# Patient Record
Sex: Male | Born: 1955 | Race: Black or African American | Hispanic: No | Marital: Single | Smoking: Never smoker
Health system: Southern US, Community
[De-identification: ages and names within clinical notes are randomized; demographics above are authoritative.]

## PROBLEM LIST (undated history)

## (undated) DIAGNOSIS — E785 Hyperlipidemia, unspecified: Secondary | ICD-10-CM

## (undated) DIAGNOSIS — Z9889 Other specified postprocedural states: Secondary | ICD-10-CM

## (undated) HISTORY — DX: Other specified postprocedural states: Z98.890

---

## 1984-08-08 HISTORY — PX: OTHER SURGICAL HISTORY: SHX169

## 2011-05-16 ENCOUNTER — Encounter: Payer: Self-pay | Admitting: Emergency Medicine

## 2011-05-16 ENCOUNTER — Emergency Department (HOSPITAL_COMMUNITY)
Admission: EM | Admit: 2011-05-16 | Discharge: 2011-05-16 | Disposition: A | Discharge status: Home / Self Care | Payer: Self-pay | Attending: Emergency Medicine | Admitting: Emergency Medicine

## 2011-05-16 ENCOUNTER — Emergency Department (HOSPITAL_COMMUNITY): Payer: Self-pay

## 2011-05-16 DIAGNOSIS — R079 Chest pain, unspecified: Secondary | ICD-10-CM | POA: Insufficient documentation

## 2011-05-16 LAB — CBC
HCT: 45.6 % (ref 39.0–52.0)
MCH: 30.3 pg (ref 26.0–34.0)
MCHC: 34 g/dL (ref 30.0–36.0)
MCV: 89.2 fL (ref 78.0–100.0)
RDW: 12.7 % (ref 11.5–15.5)

## 2011-05-16 LAB — CARDIAC PANEL(CRET KIN+CKTOT+MB+TROPI): Relative Index: 1.5 (ref 0.0–2.5)

## 2011-05-16 LAB — BASIC METABOLIC PANEL
BUN: 15 mg/dL (ref 6–23)
Calcium: 9.8 mg/dL (ref 8.4–10.5)
Creatinine, Ser: 1.02 mg/dL (ref 0.50–1.35)
GFR calc Af Amer: >90 mL/min (ref >90)
GFR calc non Af Amer: 81 mL/min — ABNORMAL LOW (ref >90)

## 2011-05-16 MED ORDER — SODIUM CHLORIDE 0.9 % IV SOLN: INTRAVENOUS | Status: DC

## 2011-05-16 MED ORDER — ASPIRIN 81 MG PO CHEW
324.0000 mg | CHEWABLE_TABLET | Freq: Once | ORAL | Status: AC
  Administered 2011-05-16: 324 mg via ORAL
  Filled 2011-05-16: qty 4

## 2011-05-16 MED ORDER — IOHEXOL 350 MG/ML SOLN
100.0000 mL | Freq: Once | INTRAVENOUS | Status: AC | PRN
  Administered 2011-05-16: 100 mL via INTRAVENOUS

## 2011-05-16 NOTE — ED Notes (Signed)
Chest pain started yesterday to L side of chest that was intermittant. Constant today with pain radiating down l arm. Nondiaphoretic. Denies sob/n/v/d/dizziness. States both arms were swollen yesterday but normal now.

## 2011-05-16 NOTE — ED Provider Notes (Signed)
History   Chart scribed for Doug Sou, MD by Enos Fling; the patient was seen in room APA02/APA02; this patient's care was started at 12:57 PM.    CSN: [id number] Arrival date & time: 05/16/2011 12:27 PM  Chief Complaint  Patient presents with  . Chest Pain   HPI Victor Mathis is a 55 y.o. male who presents to the Emergency Department complaining of chest pain. Pt reports intermittent anterior chest pain onset yesterday at 10 AM. Pain is radiating to bilateral arms today. Pain is worse with lying down and improved with sitting up. No change in pain with walking or exertion. Denies sob, n/v, or diaphoresis. Pt tried milk of magnesia with no relief. States he is asymptomatic currently. Cardiac risk factors of male and age, otherwise none.   History reviewed. No pertinent past medical history.  History reviewed. No pertinent past surgical history.  History reviewed. No pertinent family history.  History  Substance Use Topics  . Smoking status: Never Smoker   . Smokeless tobacco: Not on file  . Alcohol Use: No      Review of Systems  Constitutional: Negative.   HENT: Negative.   Respiratory: Negative.   Cardiovascular: Positive for chest pain.  Gastrointestinal: Negative.   Musculoskeletal: Negative.   Skin: Negative.   Neurological: Negative.   Hematological: Negative.   Psychiatric/Behavioral: Negative.     Allergies  Review of patient's allergies indicates no known allergies.  Home Medications  No current outpatient prescriptions on file.  BP 167/81  Pulse 66  Temp(Src) 98 F (36.7 C) (Oral)  Resp 22  Ht 5\' 7"  (1.702 m)  Wt 145 lb (65.772 kg)  BMI 22.71 kg/m2  SpO2 100%  Physical Exam  Constitutional: He appears well-developed and well-nourished.  HENT:  Head: Normocephalic and atraumatic.  Eyes: Conjunctivae are normal. Pupils are equal, round, and reactive to light.  Neck: Neck supple. No tracheal deviation present. No thyromegaly present.    Cardiovascular: Normal rate and regular rhythm.   No murmur heard. Pulmonary/Chest: Effort normal and breath sounds normal. He exhibits no tenderness.  Abdominal: Soft. Bowel sounds are normal. He exhibits no distension. There is no tenderness.  Musculoskeletal: Normal range of motion. He exhibits no edema and no tenderness.  Neurological: He is alert. Coordination normal.  Skin: Skin is warm and dry. No rash noted.  Psychiatric: He has a normal mood and affect.    ED Course  Procedures - none  Labs Reviewed  BASIC METABOLIC PANEL - Abnormal; Notable for the following:    GFR calc non Af Amer 81 (*)    All other components within normal limits  CARDIAC PANEL(CRET KIN+CKTOT+MB+TROPI) - Abnormal; Notable for the following:    Total CK 240 (*)    All other components within normal limits  CBC  TROPONIN I   Dg Chest Portable 1 View  05/16/2011  *RADIOLOGY REPORT*  Clinical Data: Chest pain  PORTABLE CHEST - 1 VIEW  Comparison: Portable exam 1301 hours without priors for comparison.  Findings: Normal heart size, mediastinal contours, and pulmonary vascularity. Lungs clear. No pleural effusion or pneumothorax. Osseous structures unremarkable.  IMPRESSION: No acute abnormalities.  Original Report Authenticated By: Lollie Marrow, M.D.     MDM  Chest pain felt to be highly atypical for acute coronary syndrome in this male with no risk factors other cardiac risk factors nonexertional pain and normal EKG. Atypical for aortic dissection, normal chest x-ray strongly doubt pulmonary embolism i.e. no shortness of breath Spoke  with Dr.Rothbart. Plan an appointment has been scheduled with the [facility] cardiology office for 05/20/2011 at 1:40 PM Diagnoses atypical chest SCRIBE ATTESTATION:  I personally performed the services described in this documentation, which was scribed in my presence. The recorded information has been reviewed and considered.        Doug Sou, MD 05/16/11  1625

## 2011-05-16 NOTE — ED Notes (Signed)
Pt up to bathroom without assistance.  States while on his way back to room, pt had episode of left sided cp radiating down left arm.  States only lasted a few seconds and after sitting, it went away.  States pain only comes when he stands.  nad noted at this time.  Denies pain at this time.

## 2011-05-20 ENCOUNTER — Ambulatory Visit: Payer: Self-pay | Admitting: Cardiology

## 2011-05-28 ENCOUNTER — Emergency Department: Payer: Self-pay | Admitting: *Deleted

## 2011-06-02 ENCOUNTER — Ambulatory Visit (INDEPENDENT_AMBULATORY_CARE_PROVIDER_SITE_OTHER): Payer: Self-pay | Admitting: Cardiology

## 2011-06-02 ENCOUNTER — Encounter: Payer: Self-pay | Admitting: Cardiology

## 2011-06-02 DIAGNOSIS — M79609 Pain in unspecified limb: Secondary | ICD-10-CM

## 2011-06-02 DIAGNOSIS — R079 Chest pain, unspecified: Secondary | ICD-10-CM | POA: Insufficient documentation

## 2011-06-02 DIAGNOSIS — M79603 Pain in arm, unspecified: Secondary | ICD-10-CM | POA: Insufficient documentation

## 2011-06-02 NOTE — Assessment & Plan Note (Signed)
These particular symptoms seem much more musculoskeletal in etiology.

## 2011-06-02 NOTE — Progress Notes (Signed)
Clinical Summary Victor Mathis is a 55 y.o.male referred for cardiology consultation by Dr. Ethelda Chick in the ER. Patient was seen back in early October with chest pain symptoms, had normal troponin at that time, also essentially normal chest CT angiogram. Patient was discussed with Dr. Dietrich Pates, and an office visit was recommended.  Victor Mathis states that he has not had any regular medical care over the years, does not have health insurance. Since his visit at [facility], he was also seen in the ER at [facility] with similar symptoms. He states that he was referred to a primary care physician and plans to see her next week.  He describes intermittent left arm and chest pain symptoms over the last several weeks. Interestingly, he states that his dog pulled him abruptly while holding the leash in the left arm, and has had left arm pain and weakness since that time, mainly in the biceps area. He has also had a "tightness" in his left chest that seems more exertional in nature. He has had some improvement in symptoms with pain medication and simethicone.  His ECG is reviewed and is essentially normal. He has not undergone any prior risk stratification. Reports no known history of hypertension, hyperlipidemia, or diabetes. Denies any smoking history. No definite family history of premature cardiovascular disease.   No Known Allergies  Medication list reviewed.  History reviewed. No pertinent past medical history.  Past Surgical History  Procedure Date  . Left eye surgery 1986    [place]    Family History  Problem Relation Age of Onset  . Diabetes type II Father   . Dementia Mother     Social History Victor Mathis reports that he has never smoked. He does not have any smokeless tobacco history on file. Victor Mathis reports that he drinks alcohol.  Review of Systems No palpitations or syncope. No speech or progressive motor deficits. Reports stable appetite. Otherwise negative except as  outlined.  Physical Examination Filed Vitals:   06/02/11 0912  BP: 137/87  Pulse: 61  Resp: 16   Normally nourished appearing male in no acute distress. HEENT: Conjunctiva and lids are normal, oropharynx with moist mucosa. Neck: Supple, no elevated JVP or bruits, no thyromegaly. Lungs: Clear to auscultation, nonlabored. Cardiac: Regular rate and rhythm, no significant murmur rub or gallop. Abdomen: Soft, nontender, bowel sounds present. Skin: Warm and dry. Extremities: No pitting edema, distal pulses full. Does have some discomfort in the left arm with flexion. Musculoskeletal: No kyphosis. Neuropsychiatric: Alert and oriented x3, affect appropriate.  ECG Sinus bradycardia at 55, normal.   Problem List and Plan

## 2011-06-02 NOTE — Assessment & Plan Note (Signed)
Largely atypical in description, somewhat better with simethicone and analgesics, however he does describe an exertional component as well. A male in his 85s does have some increased cardiovascular risk, although he denies any known history of hypertension, diabetes, or hyperlipidemia. No definite family history of premature CAD. Chest CT angiogram was essentially normal as was troponin level during his ER visit earlier this month. Baseline ECG is also normal. Plan is to perform a standard treadmill test, and if normal, would likely not pursue any further cardiovascular testing unless his symptoms progress. I did explain that it was important for him to establish with a primary care provider to have more regular followup.

## 2011-06-02 NOTE — Patient Instructions (Signed)
**Note De-Identified Victor Mathis Obfuscation** Your physician has requested that you have an exercise tolerance test. For further information please visit [email or web address] Please also follow instruction sheet, as given.  Your physician recommends that you continue on your current medications as directed. Please refer to the Current Medication list given to you today.  Your physician recommends that you schedule a follow-up appointment in: as needed

## 2011-06-09 ENCOUNTER — Ambulatory Visit (INDEPENDENT_AMBULATORY_CARE_PROVIDER_SITE_OTHER): Payer: Self-pay | Admitting: *Deleted

## 2011-06-09 DIAGNOSIS — R079 Chest pain, unspecified: Secondary | ICD-10-CM

## 2011-06-09 NOTE — Progress Notes (Signed)
Stress Lab Nurses Notes - Victor Mathis  [name] 06/09/2011  Reason for doing test: Chest Pain  Type of test: Regular GTX  Nurse performing test: Parke Poisson, RN  Nuclear Medicine Tech: Not Applicable  Echo Tech: Not Applicable  MD performing test: R. Rothbart  Family MD: NPCP  Test explained and consent signed: yes  IV started: No IV started  Symptoms: Fatigue and chest discomfort  Treatment/Intervention: None  Reason test stopped: fatigue and reached target HR  After recovery IV was: NA  Patient to return to Nuc. Med at : NA  Patient discharged: Home  Patient's Condition upon discharge was: stable  Comments:  During test peak BP 158/78 & HR 155 .   Recovery BP 128/68 & HR 78.  Symptoms resolved in recovery.  Erskine Speed T

## 2011-06-10 ENCOUNTER — Encounter: Payer: Self-pay | Admitting: Cardiology

## 2011-06-15 ENCOUNTER — Other Ambulatory Visit: Payer: Self-pay

## 2011-06-15 ENCOUNTER — Other Ambulatory Visit: Payer: Self-pay | Admitting: Cardiology

## 2011-06-15 DIAGNOSIS — M79609 Pain in unspecified limb: Secondary | ICD-10-CM

## 2011-06-15 DIAGNOSIS — R079 Chest pain, unspecified: Secondary | ICD-10-CM

## 2011-06-16 ENCOUNTER — Ambulatory Visit (INDEPENDENT_AMBULATORY_CARE_PROVIDER_SITE_OTHER): Payer: Self-pay | Admitting: Cardiology

## 2011-06-16 ENCOUNTER — Encounter: Payer: Self-pay | Admitting: Cardiology

## 2011-06-16 VITALS — BP 133/77 | HR 79 | Ht 67.0 in | Wt 149.0 lb

## 2011-06-16 DIAGNOSIS — E782 Mixed hyperlipidemia: Secondary | ICD-10-CM | POA: Insufficient documentation

## 2011-06-16 DIAGNOSIS — Z13220 Encounter for screening for lipoid disorders: Secondary | ICD-10-CM

## 2011-06-16 DIAGNOSIS — I2510 Atherosclerotic heart disease of native coronary artery without angina pectoris: Secondary | ICD-10-CM

## 2011-06-16 MED ORDER — NITROGLYCERIN 0.4 MG SL SUBL: 0.4000 mg | SUBLINGUAL_TABLET | SUBLINGUAL | Status: DC | PRN

## 2011-06-16 MED ORDER — SIMVASTATIN 20 MG PO TABS: 20.0000 mg | ORAL_TABLET | Freq: Every day | ORAL | Status: DC

## 2011-06-16 MED ORDER — METOPROLOL SUCCINATE ER 25 MG PO TB24: 25.0000 mg | ORAL_TABLET | Freq: Every day | ORAL | Status: DC

## 2011-06-16 NOTE — Assessment & Plan Note (Signed)
Baseline fasting lipid profile to be obtained as well. Statin therapy has already been initiated for plaque stabilization and risk reduction.

## 2011-06-16 NOTE — Progress Notes (Signed)
Clinical Summary Victor Mathis is a 55 y.o.male presenting for followup. He was seen recently in October with somewhat atypical chest pains, some with exertion, others that seemed more musculoskeletal. I referred him for a standard GXT, which was found to be abnormal by electrocardiographic criteria at a maximum workload of 9.8 METs, peak blood pressure 158/78, peak ST segment depression of approximately 1.5 -2.0 millimeters in leads 2, 3, aVF, 1.0 -1.5 mm in V4 through V6.  We discussed the results and implications of his testing, likelihood that he has underlying CAD, and options for treatment and long-term management. He has been on no specific medical therapy as yet, certainly a reasonable first step. May need to pursue further testing depending on symptom control. He voiced comfort with this approach.  He has not had a lipid profile to assess his cholesterol status.   No Known Allergies  Medication list reviewed.  No past medical history on file.  Past Surgical History  Procedure Date  . Left eye surgery 1986    [place]    Family History  Problem Relation Age of Onset  . Diabetes type II Father   . Dementia Mother     Social History Victor Mathis reports that he has never smoked. He does not have any smokeless tobacco history on file. Victor Mathis reports that he drinks alcohol.  Review of Systems No palpitations or syncope. Stable appetite. No orthopnea. Otherwise negative.  Physical Examination Filed Vitals:   06/16/11 0842  BP: 133/77  Pulse: 79    Normally nourished appearing male in no acute distress.  HEENT: Conjunctiva and lids are normal, oropharynx with moist mucosa.  Neck: Supple, no elevated JVP or bruits, no thyromegaly.  Lungs: Clear to auscultation, nonlabored.  Cardiac: Regular rate and rhythm, no significant murmur rub or gallop.  Abdomen: Soft, nontender, bowel sounds present.  Skin: Warm and dry.  Extremities: No pitting edema, distal pulses  full. Musculoskeletal: No kyphosis.  Neuropsychiatric: Alert and oriented x3, affect appropriate.    Problem List and Plan

## 2011-06-16 NOTE — Patient Instructions (Signed)
Your physician recommends that you schedule a follow-up appointment in: 1 month with Dr [facility]   Your physician has recommended you make the following change in your medication:   Nitroglycerin 0.4 mg as needed for chest discomfort Toprol xl 25 mg daily Simvastatin 20 mg at bedtime  Your physician recommends that you return for lab work in: This week (Lipids)

## 2011-06-16 NOTE — Assessment & Plan Note (Signed)
Diagnosis based on symptoms and also recent abnormal GXT. As noted above, initial plan is to institute basic medical therapy and arrange followup. Plan to begin aspirin 325 mg daily, Toprol-XL 25 mg daily, Zocor 20 mg daily, and nitroglycerin p.r.n - all generic formulations. We are also requesting a social work consult to see if the patient qualifies for any specific health care coverage or assistance, as additional studies may be required down the road.

## 2011-07-18 ENCOUNTER — Ambulatory Visit (INDEPENDENT_AMBULATORY_CARE_PROVIDER_SITE_OTHER): Payer: Self-pay | Admitting: Cardiology

## 2011-07-18 ENCOUNTER — Encounter: Payer: Self-pay | Admitting: Cardiology

## 2011-07-18 VITALS — BP 108/73 | HR 65 | Resp 16 | Ht 67.0 in | Wt 144.0 lb

## 2011-07-18 DIAGNOSIS — E782 Mixed hyperlipidemia: Secondary | ICD-10-CM

## 2011-07-18 DIAGNOSIS — I2510 Atherosclerotic heart disease of native coronary artery without angina pectoris: Secondary | ICD-10-CM

## 2011-07-18 DIAGNOSIS — Z13220 Encounter for screening for lipoid disorders: Secondary | ICD-10-CM

## 2011-07-18 NOTE — Assessment & Plan Note (Signed)
Diagnosis based on abnormal GXT noted previously. For now plan to continue medical therapy and observation after reviewing the options with him. Further testing, likely cardiac catheterization, can be considered if symptoms progress.

## 2011-07-18 NOTE — Progress Notes (Signed)
Clinical Summary Victor Mathis is a 55 y.o.male presenting for followup. He was seen in November. He reports improvement in symptoms, citing some that are likely anginal in nature and others that seem more musculoskeletal or neuropathic in nature. He reports compliance with his medications.  He states that he forgot to get lab work for evaluation of lipid status.  We reviewed options for treatment and further diagnosis, and at this point he prefers to continue medical therapy.   No Known Allergies  Medication list reviewed.  Past Medical History  Diagnosis Date  . Coronary atherosclerosis of native coronary artery     Based on abnormal GXT    Social History Victor Mathis reports that he has never smoked. He does not have any smokeless tobacco history on file. Victor Mathis reports that he drinks alcohol.  Review of Systems No palpitations or syncope. Otherwise negative except as outlined above.  Physical Examination Filed Vitals:   07/18/11 0852  BP: 108/73  Pulse: 65  Resp: 16   Normally nourished appearing male in no acute distress.  HEENT: Conjunctiva and lids are normal, oropharynx with moist mucosa.  Neck: Supple, no elevated JVP or bruits, no thyromegaly.  Lungs: Clear to auscultation, nonlabored.  Cardiac: Regular rate and rhythm, no significant murmur rub or gallop.  Abdomen: Soft, nontender, bowel sounds present.  Skin: Warm and dry.  Extremities: No pitting edema, distal pulses full.  Musculoskeletal: No kyphosis.  Neuropsychiatric: Alert and oriented x3, affect appropriate.    Problem List and Plan

## 2011-07-18 NOTE — Assessment & Plan Note (Signed)
Patient did not present for lab work. Will arrange LFT and FLP since on Zocor.

## 2011-07-18 NOTE — Patient Instructions (Signed)
Your physician recommends that you continue on your current medications as directed. Please refer to the Current Medication list given to you today.  Your physician recommends that you return for lab work in: THIS WEEK  Your physician recommends that you schedule a follow-up appointment in: 3 months

## 2011-07-19 LAB — LIPID PANEL
Cholesterol: 161 mg/dL (ref 0–200)
Triglycerides: 144 mg/dL (ref <150)
VLDL: 29 mg/dL (ref 0–40)

## 2011-07-19 LAB — HEPATIC FUNCTION PANEL
ALT: 12 U/L (ref 0–53)
Indirect Bilirubin: 0.4 mg/dL (ref 0.0–0.9)
Total Protein: 7.3 g/dL (ref 6.0–8.3)

## 2011-09-04 ENCOUNTER — Emergency Department: Payer: Self-pay | Admitting: Internal Medicine

## 2011-10-17 ENCOUNTER — Encounter: Payer: Self-pay | Admitting: Cardiology

## 2011-10-17 ENCOUNTER — Ambulatory Visit (INDEPENDENT_AMBULATORY_CARE_PROVIDER_SITE_OTHER): Payer: Self-pay | Admitting: Cardiology

## 2011-10-17 VITALS — BP 119/80 | HR 59 | Resp 16 | Ht 67.0 in | Wt 148.0 lb

## 2011-10-17 DIAGNOSIS — M79609 Pain in unspecified limb: Secondary | ICD-10-CM

## 2011-10-17 DIAGNOSIS — Z13220 Encounter for screening for lipoid disorders: Secondary | ICD-10-CM

## 2011-10-17 DIAGNOSIS — M79603 Pain in arm, unspecified: Secondary | ICD-10-CM

## 2011-10-17 DIAGNOSIS — I2510 Atherosclerotic heart disease of native coronary artery without angina pectoris: Secondary | ICD-10-CM

## 2011-10-17 DIAGNOSIS — E782 Mixed hyperlipidemia: Secondary | ICD-10-CM

## 2011-10-17 NOTE — Assessment & Plan Note (Signed)
Continue statin therapy. Check FLP and LFT for next visit.

## 2011-10-17 NOTE — Assessment & Plan Note (Signed)
Symptomatically stable on medical therapy. No changes made today. Keep followup with [facility] in interim.

## 2011-10-17 NOTE — Patient Instructions (Signed)
**Note De-identified Otillia Cordone Obfuscation** Your physician recommends that you continue on your current medications as directed. Please refer to the Current Medication list given to you today.  Your physician recommends that you return for lab work in: just before next visit in 6 months  Your physician recommends that you schedule a follow-up appointment in: 6 months   

## 2011-10-17 NOTE — Progress Notes (Signed)
   Clinical Summary [name] is a 56 y.o.male presenting for followup. He was seen in December 2012. We have been managing him medically for probable CAD based on abnormal GXT and symptoms.  Lab work from December 2012 showed cholesterol 161, triglycerides 144, HDL 44, LDL 88, AST 18, ALT 12. We reviewed these today.  He reports no clear angina. Does have atypical left shoulder discomfort and spasms at night, especially when he sleeps on his left side.  He states that he has been taking his medications most of the time.   No Known Allergies  Current Outpatient Prescriptions  Medication Sig Dispense Refill  . aspirin 81 MG tablet Take 81 mg by mouth daily.        . metoprolol succinate (TOPROL-XL) 25 MG 24 hr tablet Take 1 tablet (25 mg total) by mouth daily.  30 tablet  12  . nitroGLYCERIN (NITROSTAT) 0.4 MG SL tablet Place 1 tablet (0.4 mg total) under the tongue every 5 (five) minutes as needed for chest pain.  30 tablet  3  . Simethicone (GAS RELIEF PO) Take by mouth as needed.        . simvastatin (ZOCOR) 20 MG tablet Take 1 tablet (20 mg total) by mouth at bedtime.  30 tablet  12    Past Medical History  Diagnosis Date  . Coronary atherosclerosis of native coronary artery     Based on abnormal GXT    Social History [name] reports that he has never smoked. He does not have any smokeless tobacco history on file. [name] reports that he drinks alcohol.  Review of Systems No palpitations or syncope. Reports trouble with his memory, also vision problems - he plans to be seen at the [facility] to discuss these issues. Otherwise negative.  Physical Examination Filed Vitals:   10/17/11 0843  BP: 119/80  Pulse: 59  Resp: 16    Normally nourished appearing male in no acute distress.  HEENT: Conjunctiva and lids are normal, oropharynx with moist mucosa.  Neck: Supple, no elevated JVP or bruits, no thyromegaly.  Lungs: Clear to auscultation, nonlabored.  Cardiac: Regular  rate and rhythm, no significant murmur rub or gallop.  Abdomen: Soft, nontender, bowel sounds present.  Skin: Warm and dry.  Extremities: No pitting edema, distal pulses full.  Musculoskeletal: No kyphosis.  Neuropsychiatric: Alert and oriented x3, affect appropriate.    Problem List and Plan

## 2011-10-17 NOTE — Assessment & Plan Note (Signed)
Some of these symptoms are atypical and likely musculoskeletal.

## 2012-01-02 ENCOUNTER — Emergency Department: Payer: Self-pay | Admitting: *Deleted

## 2012-01-02 LAB — COMPREHENSIVE METABOLIC PANEL
Anion Gap: 12 (ref 7–16)
BUN: 13 mg/dL (ref 7–18)
Chloride: 103 mmol/L (ref 98–107)
Co2: 24 mmol/L (ref 21–32)
Creatinine: 1.25 mg/dL (ref 0.60–1.30)
EGFR (African American): >60
Osmolality: 279 (ref 275–301)
Potassium: 3.6 mmol/L (ref 3.5–5.1)
SGOT(AST): 29 U/L (ref 15–37)
SGPT (ALT): 30 U/L
Sodium: 139 mmol/L (ref 136–145)
Total Protein: 7.8 g/dL (ref 6.4–8.2)

## 2012-01-02 LAB — CBC
HGB: 15.2 g/dL (ref 13.0–18.0)
MCHC: 33.5 g/dL (ref 32.0–36.0)
RBC: 5.17 10*6/uL (ref 4.40–5.90)
RDW: 14.1 % (ref 11.5–14.5)
WBC: 11.1 10*3/uL — ABNORMAL HIGH (ref 3.8–10.6)

## 2012-01-02 LAB — CK TOTAL AND CKMB (NOT AT ARMC): CK, Total: 114 U/L (ref 35–232)

## 2012-01-02 LAB — TROPONIN I: Troponin-I: <0.02 ng/mL

## 2012-01-18 ENCOUNTER — Emergency Department (HOSPITAL_COMMUNITY): Payer: Medicaid Other

## 2012-01-18 ENCOUNTER — Inpatient Hospital Stay (HOSPITAL_COMMUNITY)
Admission: EM | Admit: 2012-01-18 | Discharge: 2012-01-20 | DRG: 287 | Disposition: A | Discharge status: Home / Self Care | Payer: Medicaid Other | Attending: Internal Medicine | Admitting: Internal Medicine

## 2012-01-18 ENCOUNTER — Encounter (HOSPITAL_COMMUNITY): Payer: Self-pay | Admitting: *Deleted

## 2012-01-18 DIAGNOSIS — M62838 Other muscle spasm: Secondary | ICD-10-CM

## 2012-01-18 DIAGNOSIS — A0472 Enterocolitis due to Clostridium difficile, not specified as recurrent: Secondary | ICD-10-CM | POA: Diagnosis present

## 2012-01-18 DIAGNOSIS — R079 Chest pain, unspecified: Principal | ICD-10-CM | POA: Diagnosis present

## 2012-01-18 DIAGNOSIS — I2510 Atherosclerotic heart disease of native coronary artery without angina pectoris: Secondary | ICD-10-CM | POA: Diagnosis present

## 2012-01-18 DIAGNOSIS — G8929 Other chronic pain: Secondary | ICD-10-CM

## 2012-01-18 DIAGNOSIS — E782 Mixed hyperlipidemia: Secondary | ICD-10-CM | POA: Diagnosis present

## 2012-01-18 DIAGNOSIS — I200 Unstable angina: Secondary | ICD-10-CM

## 2012-01-18 HISTORY — DX: Hyperlipidemia, unspecified: E78.5

## 2012-01-18 LAB — BASIC METABOLIC PANEL
GFR calc Af Amer: 80 mL/min — ABNORMAL LOW (ref >90)
GFR calc non Af Amer: 69 mL/min — ABNORMAL LOW (ref >90)
Potassium: 3.8 mEq/L (ref 3.5–5.1)
Sodium: 137 mEq/L (ref 135–145)

## 2012-01-18 LAB — DIFFERENTIAL
Basophils Relative: 0 % (ref 0–1)
Eosinophils Absolute: 0.4 10*3/uL (ref 0.0–0.7)
Lymphs Abs: 2.5 10*3/uL (ref 0.7–4.0)
Neutrophils Relative %: 55 % (ref 43–77)

## 2012-01-18 LAB — CARDIAC PANEL(CRET KIN+CKTOT+MB+TROPI)
CK, MB: 2.3 ng/mL (ref 0.3–4.0)
Relative Index: INVALID (ref 0.0–2.5)
Troponin I: <0.3 ng/mL (ref <0.30)

## 2012-01-18 LAB — CBC
MCH: 29.2 pg (ref 26.0–34.0)
MCHC: 33.9 g/dL (ref 30.0–36.0)
Platelets: 247 10*3/uL (ref 150–400)
RBC: 4.93 MIL/uL (ref 4.22–5.81)

## 2012-01-18 LAB — HEMOGLOBIN A1C
Hgb A1c MFr Bld: 5.8 % — ABNORMAL HIGH (ref <5.7)
Mean Plasma Glucose: 120 mg/dL — ABNORMAL HIGH (ref <117)

## 2012-01-18 MED ORDER — SODIUM CHLORIDE 0.9 % IV SOLN: 250.0000 mL | INTRAVENOUS | Status: DC | PRN

## 2012-01-18 MED ORDER — SODIUM CHLORIDE 0.9 % IJ SOLN
3.0000 mL | INTRAMUSCULAR | Status: DC | PRN
  Administered 2012-01-18: 3 mL via INTRAVENOUS

## 2012-01-18 MED ORDER — ASPIRIN 81 MG PO CHEW
81.0000 mg | CHEWABLE_TABLET | Freq: Every day | ORAL | Status: DC
  Administered 2012-01-19 – 2012-01-20 (×2): 81 mg via ORAL
  Filled 2012-01-18 (×2): qty 1

## 2012-01-18 MED ORDER — BIOTENE DRY MOUTH MT LIQD: 15.0000 mL | OROMUCOSAL | Status: DC | PRN

## 2012-01-18 MED ORDER — SIMVASTATIN 20 MG PO TABS
20.0000 mg | ORAL_TABLET | Freq: Every day | ORAL | Status: DC
  Administered 2012-01-18 – 2012-01-19 (×2): 20 mg via ORAL
  Filled 2012-01-18 (×3): qty 1

## 2012-01-18 MED ORDER — SIMETHICONE 80 MG PO CHEW: 80.0000 mg | CHEWABLE_TABLET | Freq: Four times a day (QID) | ORAL | Status: DC | PRN

## 2012-01-18 MED ORDER — SODIUM CHLORIDE 0.9 % IJ SOLN: 3.0000 mL | Freq: Two times a day (BID) | INTRAMUSCULAR | Status: DC

## 2012-01-18 MED ORDER — HEPARIN (PORCINE) IN NACL 100-0.45 UNIT/ML-% IJ SOLN
900.0000 [IU]/h | INTRAMUSCULAR | Status: DC
  Administered 2012-01-18: 1000 [IU]/h via INTRAVENOUS
  Filled 2012-01-18 (×2): qty 250

## 2012-01-18 MED ORDER — NITROGLYCERIN 0.4 MG SL SUBL
0.4000 mg | SUBLINGUAL_TABLET | SUBLINGUAL | Status: DC | PRN
  Administered 2012-01-18 (×2): 0.4 mg via SUBLINGUAL
  Filled 2012-01-18: qty 25

## 2012-01-18 MED ORDER — ALPRAZOLAM 0.25 MG PO TABS
0.2500 mg | ORAL_TABLET | Freq: Two times a day (BID) | ORAL | Status: DC | PRN
  Administered 2012-01-18: 0.25 mg via ORAL
  Filled 2012-01-18: qty 1

## 2012-01-18 MED ORDER — SODIUM CHLORIDE 0.9 % IJ SOLN
3.0000 mL | Freq: Two times a day (BID) | INTRAMUSCULAR | Status: DC
  Administered 2012-01-19 – 2012-01-20 (×3): 3 mL via INTRAVENOUS

## 2012-01-18 MED ORDER — ZOLPIDEM TARTRATE 5 MG PO TABS: 10.0000 mg | ORAL_TABLET | Freq: Every evening | ORAL | Status: DC | PRN

## 2012-01-18 MED ORDER — DIAZEPAM 5 MG PO TABS
5.0000 mg | ORAL_TABLET | ORAL | Status: AC
  Administered 2012-01-19: 5 mg via ORAL
  Filled 2012-01-18: qty 1

## 2012-01-18 MED ORDER — ACETAMINOPHEN 325 MG PO TABS
650.0000 mg | ORAL_TABLET | ORAL | Status: DC | PRN
  Administered 2012-01-18 – 2012-01-20 (×3): 650 mg via ORAL
  Filled 2012-01-18 (×3): qty 2

## 2012-01-18 MED ORDER — HEPARIN BOLUS VIA INFUSION
4000.0000 [IU] | Freq: Once | INTRAVENOUS | Status: AC
  Administered 2012-01-18: 4000 [IU] via INTRAVENOUS

## 2012-01-18 MED ORDER — SODIUM CHLORIDE 0.9 % IV SOLN: INTRAVENOUS | Status: DC

## 2012-01-18 MED ORDER — METOPROLOL SUCCINATE ER 25 MG PO TB24
25.0000 mg | ORAL_TABLET | Freq: Every day | ORAL | Status: DC
  Administered 2012-01-19 – 2012-01-20 (×2): 25 mg via ORAL
  Filled 2012-01-18 (×2): qty 1

## 2012-01-18 MED ORDER — SODIUM CHLORIDE 0.9 % IJ SOLN: 3.0000 mL | INTRAMUSCULAR | Status: DC | PRN

## 2012-01-18 MED ORDER — ONDANSETRON HCL 4 MG/2ML IJ SOLN: 4.0000 mg | Freq: Four times a day (QID) | INTRAMUSCULAR | Status: DC | PRN

## 2012-01-18 MED ORDER — ASPIRIN 81 MG PO TABS: 81.0000 mg | ORAL_TABLET | Freq: Every day | ORAL | Status: DC

## 2012-01-18 MED ORDER — BIOTENE DRY MOUTH MT LIQD
15.0000 mL | Freq: Two times a day (BID) | OROMUCOSAL | Status: DC
  Administered 2012-01-18 – 2012-01-20 (×4): 15 mL via OROMUCOSAL

## 2012-01-18 MED ORDER — NITROGLYCERIN 0.4 MG SL SUBL: 0.4000 mg | SUBLINGUAL_TABLET | SUBLINGUAL | Status: DC | PRN

## 2012-01-18 MED ORDER — TICAGRELOR 90 MG PO TABS
180.0000 mg | ORAL_TABLET | Freq: Once | ORAL | Status: AC
  Administered 2012-01-18: 180 mg via ORAL
  Filled 2012-01-18: qty 2

## 2012-01-18 NOTE — Progress Notes (Signed)
ANTICOAGULATION CONSULT NOTE - Follow Up  Pharmacy Consult for Heparin  Indication: Chest pain   No Known Allergies  Patient Measurements: Height: 5\' 7"  (170.2 cm) Weight: 147 lb 11.3 oz (67 kg) IBW/kg (Calculated) : 66.1  Heparin Dosing Weight: 67 kg  Vital Signs: Temp: 98.2 F (36.8 C) (06/12 2000) Temp src: Oral (06/12 2000) BP: 114/80 mmHg (06/12 2000) Pulse Rate: 65  (06/12 2000)  Labs:  Basename 01/18/12 1436  HGB 14.4  HCT 42.5  PLT 247  CREATININE 1.16  TROPONINI <0.30    Estimated Creatinine Clearance: 66.5 ml/min (by C-G formula based on Cr of 1.16).  Medical History: Past Medical History  Diagnosis Date  . Coronary atherosclerosis of native coronary artery     Based on abnormal GXT  . Hyperlipidemia     Medications:  Prescriptions prior to admission  Medication Sig Dispense Refill  . aspirin EC 81 MG tablet Take 162 mg by mouth every 6 (six) hours as needed. For pain      . metoprolol succinate (TOPROL-XL) 25 MG 24 hr tablet Take 25 mg by mouth daily.      . nitroGLYCERIN (NITROSTAT) 0.4 MG SL tablet Place 0.4 mg under the tongue every 5 (five) minutes as needed. For chest pain      . simethicone (MYLICON) 80 MG chewable tablet Chew 80 mg by mouth every 6 (six) hours as needed. For gas pain      . simvastatin (ZOCOR) 20 MG tablet Take 20 mg by mouth at bedtime.        Assessment: 56 yo with history of CAD and hyperlipidemia presented with chest pain to [facility] and subsequently transferred here for further cardiac workup.  He was started on IV heparin with a 4000 unit IV bolus and a drip at 1000 units/hr.  This was started around 16:00PM.  He has no noted medical history significant for bleeding issues.  His most recent CBC reveals an H/H and platelet count that is within the desired goal range.  I discussed his Heparin therapy and offered to answer any questions (he had none).  Goal of Therapy:  Heparin level 0.3-0.7 units/ml Monitor platelets by  anticoagulation protocol: Yes   Plan:   Continue IV heparin at current rate of 1000 units/hr  Obtain a heparin level six hours from start and adjust therapy as needed  Monitor CBC and s/s of bleeding complications.  Nadara Mustard, PharmD., MS Clinical Pharmacist Pager:  [phone]  Thank you for allowing pharmacy to be part of this patients care team.  Assessment: PM Follow-Up - Xa = 0.79 IU/ml which is slightly above desired goal of 0.3-0.7.  No noted bleeding but we will decrease the rate slightly to maintain therapeutic goal.  Plan:  Decrease IV Heparin to 900 units/hr  F/U AM labs and adjust accordingly.  Nadara Mustard, PharmD., MS Clinical Pharmacist Pager:  [phone]  Thank you for allowing pharmacy to be part of this patients care team. 01/18/2012 10:32 PM

## 2012-01-18 NOTE — H&P (Signed)
History and Physical   Patient ID: Victor Mathis MRN: [id number], DOB/AGE: Apr 30, 1956   Admit date: 01/18/2012 Date of Consult: 01/18/2012   Primary Physician: No primary provider on file. Primary Cardiologist: Simona Huh, MD  Pt. Profile: Victor Mathis is a 56yo male with PMHx significant for CAD (suspected on abnormal exercise stress testing 11/12 and symptoms; treated medically) and hyperlipidemia who was transferred from [facility] to [facility] today for chest pain concerning for unstable angina.    Problem List: Past Medical History  Diagnosis Date  . Coronary atherosclerosis of native coronary artery     Based on abnormal GXT    Past Surgical History  Procedure Date  . Left eye surgery 1986    [place]     Allergies: No Known Allergies  PAST CARDIAC HISTORY  Standard GXT: found to be abnormal by electrocardiographic criteria at a maximum workload of 9.8 METs, peak blood pressure 158/78, peak ST segment depression of approximately 1.5 -2.0 millimeters in leads II, III, aVF, 1.0 -1.5 mm in V4 through V6.  HPI:   The patient was referred for standard GXT in 11/12 after seeing Dr. Diona Browner in clinic for atypical chest pain- some characterized as exertional, others more musculosketal. Results as noted above. The plan was made to pursue medical management of suspected CAD/anginal symptoms. He was started on ASA 81mg , Toprol-XL 25mg , Zocor 20mg  and NTG SL PRN. This was continued and found to be stable on follow-up, most recently in 10/2011.   He said he used to exercise regularly but stopped after his stress test last year. Gets intermittent CP. Says he was out shoveling dirt off his septic tank yesterday and did that without any problem. Last night noticed his chest was sore but though it was from working. In the middle of the night woke with chest pain radiating to jaw, arm and leg. Pain resolved then recurred this afternoon. Went to [facility]  Upon ED arrival, EKG revealed  no evidence of ischemia. POC troponin-I was WNL. CBC and BMET unremarkable. CXR without acute cardiopulmonary process. He was started on heparin gtt. Continued to have CP in ambulance. Treated with NTG and morphine. Now CP free. F/u ECG negative.    Home Medications: Prior to Admission medications   Medication Sig Start Date End Date Taking? Authorizing Provider  aspirin 81 MG tablet Take 81 mg by mouth daily.      Historical Provider, MD  metoprolol succinate (TOPROL-XL) 25 MG 24 hr tablet Take 1 tablet (25 mg total) by mouth daily. 06/16/11 06/15/12  Jonelle Sidle, MD  nitroGLYCERIN (NITROSTAT) 0.4 MG SL tablet Place 1 tablet (0.4 mg total) under the tongue every 5 (five) minutes as needed for chest pain. 06/16/11 06/15/12  Jonelle Sidle, MD  Simethicone (GAS RELIEF PO) Take by mouth as needed.      Historical Provider, MD  simvastatin (ZOCOR) 20 MG tablet Take 1 tablet (20 mg total) by mouth at bedtime. 06/16/11 06/15/12  Jonelle Sidle, MD    Inpatient Medications:     . heparin  4,000 Units Intravenous Once    (Not in a hospital admission)  Family History  Problem Relation Age of Onset  . Diabetes type II Father   . Dementia Mother      History   Social History  . Marital Status: Single    Spouse Name: N/A    Number of Children: N/A  . Years of Education: N/A   Occupational History  .  Unemployed    Social History Main Topics  . Smoking status: Never Smoker   . Smokeless tobacco: Not on file  . Alcohol Use: Yes     Occasional beer  . Drug Use: No  . Sexually Active: Not on file   Other Topics Concern  . Not on file   Social History Narrative  . No narrative on file     Review of Systems: General: negative for chills, fever, night sweats or weight changes.  Cardiovascular: negative for dyspnea on exertion, edema, orthopnea, palpitations, paroxysmal nocturnal dyspnea or shortness of breath Dermatological:  negative for rash Respiratory:negative for  cough or wheezing Urologic: negative for hematuria Abdominal: negative for nausea, vomiting, diarrhea, melena, or hematemesis. Small amountbright red blood per rectum, Neurologic: negative for visual changes, syncope, or dizziness All other systems reviewed and are otherwise negative except as noted above.  Physical Exam: Blood pressure 109/72, pulse 62, temperature 98.2 F (36.8 C), temperature source Oral, resp. rate 22, SpO2 98.00%.   General: Anxious Well developed, well nourished, in no acute distress. Head: Normocephalic, atraumatic, sclera non-icteric, no xanthomas, nares are without discharge.  Neck: Negative for carotid bruits. JVD not elevated. Lungs: Clear bilaterally to auscultation without wheezes, rales, or rhonchi. Breathing is unlabored. Heart: RRR with S1 S2. No murmurs, rubs, or gallops appreciated. Abdomen: Soft, non-tender, non-distended with normoactive bowel sounds. No hepatomegaly. No rebound/guarding. No obvious abdominal masses. Msk:  Strength and tone appears normal for age. Extremities: No clubbing, cyanosis or edema.  Distal pedal pulses are 2+ and equal bilaterally. Neuro: Alert and oriented X 3. Moves all extremities spontaneously. Psych:   Responds to questions appropriately with a normal affect.  Labs: Recent Labs  Basename 01/18/12 1436   WBC 8.5   HGB 14.4   HCT 42.5   MCV 86.2   PLT 247   No results found for this basename: VITAMINB12,FOLATE,FERRITIN,TIBC,IRON,RETICCTPCT in the last 72 hours No results found for this basename: DDIMER:2 in the last 72 hours  Lab 01/18/12 1436  NA 137  K 3.8  CL 101  CO2 25  BUN 13  CREATININE 1.16  CALCIUM 9.4  PROT --  BILITOT --  ALKPHOS --  ALT --  AST --  AMYLASE --  LIPASE --  GLUCOSE 76   No results found for this basename: HGBA1C in the last 72 hours Recent Labs  Basename 01/18/12 1436   CKTOTAL --   CKMB --   CKMBINDEX --   TROPONINI <0.30   No components found with this basename:  POCBNP No results found for this basename: CHOL,HDL,LDLCALC,TRIG,CHOLHDL,LDLDIRECT in the last 72 hours No results found for this basename: TSH,T4TOTAL,FREET3,T3FREE,THYROIDAB in the last 72 hours  Radiology/Studies: Dg Chest Port 1 View  01/18/2012  *RADIOLOGY REPORT*  Clinical Data: Chest pain.  Pain in the right side of the neck.  PORTABLE CHEST - 1 VIEW  Comparison: CT and plain film chest 05/16/2011.  Findings: Lungs are clear.  Heart size is normal.  No pneumothorax or pleural fluid.  No focal bony abnormality.  IMPRESSION: Negative chest.  Original Report Authenticated By: Bernadene Bell. D'ALESSIO, M.D.    EKG: SR No ST-T wave abnormalities.   Signed, R. Hurman Horn, PA-C 01/18/2012, 5:41 PM   ASSESSMENT: 1. CP concerning for [place] 2. + stress test 11/12 3. Hyperlipidemia  PLAN:  Attending: CP has typical and atypical features but given recent + stress test and response to NTG concern for [place] is high. Treat with asa, heparin, NTG, b-blocker,  statin. Will load Brillinta. Cath in am.  He is a bit reluctant about cath but we discussed at length and he agrees to proceed.   Patient seen and examined with Hurman Horn, PA-C. We discussed all aspects of the encounter. I agree with the assessment and plan as stated above.   Tayleigh Wetherell,MD 6:15 PM

## 2012-01-18 NOTE — Progress Notes (Signed)
ANTICOAGULATION CONSULT NOTE - Initial Consult  Pharmacy Consult for Heparin  Indication: Chest pain   No Known Allergies  Patient Measurements: Height: 5\' 7"  (170.2 cm) Weight: 147 lb 11.3 oz (67 kg) IBW/kg (Calculated) : 66.1  Heparin Dosing Weight: 67 kg  Vital Signs: Temp: 97.6 F (36.4 C) (06/12 1804) Temp src: Oral (06/12 1804) BP: 114/73 mmHg (06/12 1830) Pulse Rate: 60  (06/12 1830)  Labs:  Basename 01/18/12 1436  HGB 14.4  HCT 42.5  PLT 247  CREATININE 1.16  TROPONINI <0.30    Estimated Creatinine Clearance: 66.5 ml/min (by C-G formula based on Cr of 1.16).  Medical History: Past Medical History  Diagnosis Date  . Coronary atherosclerosis of native coronary artery     Based on abnormal GXT  . Hyperlipidemia     Medications:  Prescriptions prior to admission  Medication Sig Dispense Refill  . aspirin EC 81 MG tablet Take 162 mg by mouth every 6 (six) hours as needed. For pain      . metoprolol succinate (TOPROL-XL) 25 MG 24 hr tablet Take 25 mg by mouth daily.      . nitroGLYCERIN (NITROSTAT) 0.4 MG SL tablet Place 0.4 mg under the tongue every 5 (five) minutes as needed. For chest pain      . simethicone (MYLICON) 80 MG chewable tablet Chew 80 mg by mouth every 6 (six) hours as needed. For gas pain      . simvastatin (ZOCOR) 20 MG tablet Take 20 mg by mouth at bedtime.        Assessment: 56 yo with history of CAD and hyperlipidemia presented with chest pain to [facility] and subsequently transferred here for further cardiac workup.  He was started on IV heparin with a 4000 unit IV bolus and a drip at 1000 units/hr.  This was started around 16:00PM.  He has no noted medical history significant for bleeding issues.  His most recent CBC reveals an H/H and platelet count that is within the desired goal range.  I discussed his Heparin therapy and offered to answer any questions (he had none).  Goal of Therapy:  Heparin level 0.3-0.7 units/ml Monitor platelets by  anticoagulation protocol: Yes   Plan:   Continue IV heparin at current rate of 1000 units/hr  Obtain a heparin level six hours from start and adjust therapy as needed  Monitor CBC and s/s of bleeding complications.  Nadara Mustard, PharmD., MS Clinical Pharmacist Pager:  [phone]  Thank you for allowing pharmacy to be part of this patients care team. 01/18/2012,6:54 PM

## 2012-01-18 NOTE — ED Notes (Signed)
Left sided cp that started last night.  Also reports sharp pain down right side of head into right shoulder.  C/o sob, weakness, diaphoresis, dizziness/lightheadedness.

## 2012-01-18 NOTE — ED Provider Notes (Signed)
History     CSN: [id number]  Arrival date & time 01/18/12  1426   First MD Initiated Contact with Patient 01/18/12 1433      Chief Complaint  Patient presents with  . Chest Pain     Patient is a 56 y.o. male presenting with chest pain. The history is provided by the patient.  Chest Pain The chest pain began yesterday. Chest pain occurs constantly. The chest pain is worsening. The pain is associated with exertion. The severity of the pain is moderate. The quality of the pain is described as pressure-like and sharp. Radiates to: right neck, extremities. Chest pain is worsened by exertion. Primary symptoms include shortness of breath and dizziness. Pertinent negatives for primary symptoms include no fever.  Dizziness also occurs with weakness.  Associated symptoms include weakness. He tried aspirin for the symptoms.   Pt reports intermittent episodes of CP for several months He reports in past 24 hrs he has had consistent chest pain similar to prior episodes but now remaining at rest He admits that yesterday only with minimal exertion he had CP/SOB/dizziness Denies syncope He reports nonbloody diarrhea, but no vomiting for past several days No abdominal pain No focal weakness reported PATIENT HAS TAKEN ASA TODAY   Past Medical History  Diagnosis Date  . Coronary atherosclerosis of native coronary artery     Based on abnormal GXT    Past Surgical History  Procedure Date  . Left eye surgery 1986    [place]    Family History  Problem Relation Age of Onset  . Diabetes type II Father   . Dementia Mother     History  Substance Use Topics  . Smoking status: Never Smoker   . Smokeless tobacco: Not on file  . Alcohol Use: Yes     Occasional beer      Review of Systems  Constitutional: Negative for fever.  Respiratory: Positive for shortness of breath.   Cardiovascular: Positive for chest pain.  Neurological: Positive for dizziness and weakness.  All other systems  reviewed and are negative.    Allergies  Review of patient's allergies indicates no known allergies.  Home Medications   Current Outpatient Rx  Name Route Sig Dispense Refill  . ASPIRIN 81 MG PO TABS Oral Take 81 mg by mouth daily.      Marland Kitchen METOPROLOL SUCCINATE ER 25 MG PO TB24 Oral Take 1 tablet (25 mg total) by mouth daily. 30 tablet 12  . NITROGLYCERIN 0.4 MG SL SUBL Sublingual Place 1 tablet (0.4 mg total) under the tongue every 5 (five) minutes as needed for chest pain. 30 tablet 3  . GAS RELIEF PO Oral Take by mouth as needed.      Marland Kitchen SIMVASTATIN 20 MG PO TABS Oral Take 1 tablet (20 mg total) by mouth at bedtime. 30 tablet 12    BP 126/88  Temp 98 F (36.7 C) (Oral)  Resp 20  SpO2 100% BP 127/71  Pulse 62  Temp 98 F (36.7 C) (Oral)  Resp 25  SpO2 100%   Physical Exam CONSTITUTIONAL: Well developed/well nourished, uncomfortable appearing HEAD AND FACE: Normocephalic/atraumatic EYES: EOMI/PERRL, normal appearance ENMT: Mucous membranes moist.  No trismus.  No facial swelling.  Tenderness to angle of right mandible but no erythema noted. No oral abscess NECK: supple no meningeal signs, no bruits SPINE:entire spine nontender CV: S1/S2 noted, no murmurs/rubs/gallops noted LUNGS: Lungs are clear to auscultation bilaterally, no apparent distress ABDOMEN: soft, nontender, no rebound or  guarding Rectal - stool color normal GU:no cva tenderness NEURO: Pt is awake/alert, moves all extremitiesx4, no focal arm/leg weakness.  Face is symmetric EXTREMITIES: pulses normal, full ROM, no calf tenderness SKIN: warm, color normal PSYCH: no abnormalities of mood noted  ED Course  Procedures   Labs Reviewed  CBC  DIFFERENTIAL  BASIC METABOLIC PANEL  TROPONIN I   2:56 PM Pt with h/o abnormal GXT per cardiology, started medical management 3:53 PM Initial workup negative, but given that patient has had persistent CP with recent sob/diaphoresis (worsened from his baseline pain)  will admit. And he has had abnormal GXT.  D/w dr Gala Romney, will accept to [facility] cone tele for unstable angina and he asks me to start heparin.  Pt reports his CP is improved with NTG He reports his face is hurting but no obvious signs of infection or trauma to face.     MDM  Nursing notes including past medical history and social history reviewed and considered in documentation Previous records reviewed and considered - h/o CAD by stress imaging per cardiology notes xrays reviewed and considered All labs/vitals reviewed and considered       Date: 01/18/2012  Rate: 63  Rhythm: normal sinus rhythm  QRS Axis: normal  Intervals: normal  ST/T Wave abnormalities: normal  Conduction Disutrbances:none  Narrative Interpretation:   Old EKG Reviewed: unchanged    Joya Gaskins, MD 01/18/12 1556

## 2012-01-19 ENCOUNTER — Inpatient Hospital Stay (HOSPITAL_COMMUNITY): Payer: Medicaid Other

## 2012-01-19 ENCOUNTER — Encounter (HOSPITAL_COMMUNITY)
Admission: EM | Disposition: A | Discharge status: Home / Self Care | Payer: Self-pay | Source: Home / Self Care | Attending: Internal Medicine

## 2012-01-19 DIAGNOSIS — R079 Chest pain, unspecified: Secondary | ICD-10-CM

## 2012-01-19 HISTORY — PX: LEFT HEART CATHETERIZATION WITH CORONARY ANGIOGRAM: SHX5451

## 2012-01-19 LAB — CBC
Hemoglobin: 13.4 g/dL (ref 13.0–17.0)
MCH: 29.4 pg (ref 26.0–34.0)
MCV: 85.7 fL (ref 78.0–100.0)
Platelets: 231 10*3/uL (ref 150–400)
RBC: 4.56 MIL/uL (ref 4.22–5.81)
WBC: 5.8 10*3/uL (ref 4.0–10.5)

## 2012-01-19 LAB — LIPID PANEL
HDL: 41 mg/dL (ref >39)
LDL Cholesterol: 105 mg/dL — ABNORMAL HIGH (ref 0–99)
Total CHOL/HDL Ratio: 3.9 RATIO

## 2012-01-19 LAB — BASIC METABOLIC PANEL
CO2: 23 mEq/L (ref 19–32)
Calcium: 8.8 mg/dL (ref 8.4–10.5)
Chloride: 103 mEq/L (ref 96–112)
Creatinine, Ser: 1.07 mg/dL (ref 0.50–1.35)
Glucose, Bld: 88 mg/dL (ref 70–99)

## 2012-01-19 LAB — CARDIAC PANEL(CRET KIN+CKTOT+MB+TROPI)
CK, MB: 2.2 ng/mL (ref 0.3–4.0)
Total CK: 83 U/L (ref 7–232)
Troponin I: <0.3 ng/mL (ref <0.30)

## 2012-01-19 LAB — CLOSTRIDIUM DIFFICILE BY PCR: Toxigenic C. Difficile by PCR: POSITIVE — AB

## 2012-01-19 LAB — HEPARIN LEVEL (UNFRACTIONATED): Heparin Unfractionated: 0.59 IU/mL (ref 0.30–0.70)

## 2012-01-19 LAB — PROTIME-INR: Prothrombin Time: 14.1 seconds (ref 11.6–15.2)

## 2012-01-19 SURGERY — LEFT HEART CATHETERIZATION WITH CORONARY ANGIOGRAM
Anesthesia: LOCAL

## 2012-01-19 MED ORDER — MIDAZOLAM HCL 2 MG/2ML IJ SOLN
INTRAMUSCULAR | Status: AC
  Filled 2012-01-19: qty 2

## 2012-01-19 MED ORDER — VERAPAMIL HCL 2.5 MG/ML IV SOLN
INTRAVENOUS | Status: AC
  Filled 2012-01-19: qty 2

## 2012-01-19 MED ORDER — FENTANYL CITRATE 0.05 MG/ML IJ SOLN
INTRAMUSCULAR | Status: AC
  Filled 2012-01-19: qty 2

## 2012-01-19 MED ORDER — HEPARIN SODIUM (PORCINE) 1000 UNIT/ML IJ SOLN
INTRAMUSCULAR | Status: AC
  Filled 2012-01-19: qty 1

## 2012-01-19 MED ORDER — METRONIDAZOLE 500 MG PO TABS
500.0000 mg | ORAL_TABLET | Freq: Three times a day (TID) | ORAL | Status: DC
  Administered 2012-01-19 – 2012-01-20 (×3): 500 mg via ORAL
  Filled 2012-01-19 (×6): qty 1

## 2012-01-19 MED ORDER — SODIUM CHLORIDE 0.9 % IV SOLN: INTRAVENOUS | Status: AC

## 2012-01-19 MED ORDER — LIDOCAINE HCL (PF) 1 % IJ SOLN
INTRAMUSCULAR | Status: AC
  Filled 2012-01-19: qty 30

## 2012-01-19 MED ORDER — HEPARIN (PORCINE) IN NACL 2-0.9 UNIT/ML-% IJ SOLN
INTRAMUSCULAR | Status: AC
  Filled 2012-01-19: qty 2000

## 2012-01-19 MED ORDER — NITROGLYCERIN 0.2 MG/ML ON CALL CATH LAB
INTRAVENOUS | Status: AC
  Filled 2012-01-19: qty 1

## 2012-01-19 MED FILL — Morphine Sulfate Inj 10 MG/ML: INTRAMUSCULAR | Qty: 1 | Status: AC

## 2012-01-19 NOTE — Care Management Note (Signed)
    Page 1 of 1   01/19/2012     2:32:42 PM   CARE MANAGEMENT NOTE 01/19/2012  Patient:  Victor, Mathis   Account Number:  [id number]  Date Initiated:  01/19/2012  Documentation initiated by:  Junius Creamer  Subjective/Objective Assessment:   adm w ch pain,angina     Action/Plan:   lives alone, no ins listed   Anticipated DC Date:     Anticipated DC Plan:        DC Planning Services  CM consult      Choice offered to / List presented to:             Status of service:   Medicare Important Message given?   (If response is "NO", the following Medicare IM given date fields will be blank) Date Medicare IM given:   Date Additional Medicare IM given:    Discharge Disposition:    Per UR Regulation:  Reviewed for med. necessity/level of care/duration of stay  If discussed at Long Length of Stay Meetings, dates discussed:    Comments:  6/13 9:23a debbie Victor [employer] rn,bsn [phone] spoke w pt, no ins but does see dr Diona Browner w cardiol in [place] has been to [place] [facility] gave him inform on [facility] if wanted pcp in [place], he gets meds [facility].

## 2012-01-19 NOTE — Progress Notes (Signed)
Pt for cath today. Discussed procedure. Labs reviewed. Pt with 3 loose stools overnight. He is nervous. C. Diff is pending but this is probably related to his stress levels. Cath later this am.   Victor Mathis 6:52 AM 01/19/2012

## 2012-01-19 NOTE — CV Procedure (Signed)
    Cardiac Catheterization Operative Report  Victor Mathis [id number] 6/13/201310:30 AM No primary provider on file.  Procedure Performed:  1. Left Heart Catheterization 2. Selective Coronary Angiography 3. Left ventricular angiogram  Operator: Verne Carrow, MD  Arterial access site:  Right radial artery.   Indication: Chest pain, negative cardiac markers. Cath arranged yesterday by Dr. Gala Romney.                           Procedure Details: The risks, benefits, complications, treatment options, and expected outcomes were discussed with the patient. The patient and/or family concurred with the proposed plan, giving informed consent. The patient was brought to the cath lab after IV hydration was begun and oral premedication was given. The patient was further sedated with Versed and Fentanyl. The right wrist was assessed with an Allens test which was positive. The right wrist was prepped and draped in a sterile fashion. 1% lidocaine was used for local anesthesia. Using the modified Seldinger access technique, a 5 French sheath was placed in the right radial artery. 3 mg Verapamil was given through the sheath. 3500 units IV heparin was given. Standard diagnostic catheters were used to perform selective coronary angiography. A pigtail catheter was used to perform a left ventricular angiogram. The sheath was removed from the right radial artery and a Terumo hemostasis band was applied at the arteriotomy site on the right wrist.   There were no immediate complications. The patient was taken to the recovery area in stable condition.   Hemodynamic Findings: Central aortic pressure: 97/58 Left ventricular pressure: 99/2/2  Angiographic Findings:  Left main: No angiographic evidence of disease.   Left Anterior Descending Artery: No angiographic evidence of disease  Circumflex Artery: No angiographic evidence of disease  Right Coronary Artery: No angiographic evidence of  disease  Left Ventricular Angiogram: LVEF 55-60%.   Impression: 1. No angiographic evidence of CAD 2. Normal LV systolic function 3. Non-cardiac chest pain  Recommendations: No further ischemic workup.        Complications:  None. The patient tolerated the procedure well.

## 2012-01-19 NOTE — Progress Notes (Signed)
RN noticed right sided weakness - including numbness to face, shoulders and neck; blurred vision in right eye; and ringing in right ear.  [place] PA notified.  Will continue to monitor.

## 2012-01-19 NOTE — Progress Notes (Signed)
ANTICOAGULATION CONSULT NOTE - Follow Up  Pharmacy Consult for Heparin  Indication: Chest pain   No Known Allergies  Patient Measurements: Height: 5\' 7"  (170.2 cm) Weight: 146 lb 6.2 oz (66.4 kg) IBW/kg (Calculated) : 66.1  Heparin Dosing Weight: 67 kg  Vital Signs: Temp: 97 F (36.1 C) (06/13 0800) Temp src: Oral (06/13 0800) BP: 111/65 mmHg (06/13 0800) Pulse Rate: 59  (06/13 0800)   Estimated Creatinine Clearance: 72.1 ml/min (by C-G formula based on Cr of 1.07).  Medical History: Past Medical History  Diagnosis Date  . Coronary atherosclerosis of native coronary artery     Based on abnormal GXT  . Hyperlipidemia      Assessment: 56 yo with history of CAD and hyperlipidemia presented with chest pain to [facility] and subsequently transferred here for further cardiac workup.  Heparin level today=0.59 ang h/h= 13.4/39.1 with plt=231. Patient noted for cath today  Goal of Therapy:  Heparin level 0.3-0.7 units/ml Monitor platelets by anticoagulation protocol: Yes   Plan -No heparin changes -Will follow post cath  Harland German, Pharm D 01/19/2012 9:07 AM

## 2012-01-19 NOTE — Interval H&P Note (Signed)
History and Physical Interval Note:  01/19/2012 9:31 AM  Victor Mathis  has presented today for surgery, with the diagnosis of Chest pain  The various methods of treatment have been discussed with the patient and family. After consideration of risks, benefits and other options for treatment, the patient has consented to  Procedure(s) (LRB): LEFT HEART CATHETERIZATION WITH CORONARY ANGIOGRAM (N/A) as a surgical intervention .  The patients' history has been reviewed, patient examined, no change in status, stable for surgery.  I have reviewed the patients' chart and labs.  Questions were answered to the patient's satisfaction.     [name]

## 2012-01-20 DIAGNOSIS — A0472 Enterocolitis due to Clostridium difficile, not specified as recurrent: Secondary | ICD-10-CM

## 2012-01-20 DIAGNOSIS — R079 Chest pain, unspecified: Principal | ICD-10-CM

## 2012-01-20 DIAGNOSIS — M62838 Other muscle spasm: Secondary | ICD-10-CM

## 2012-01-20 DIAGNOSIS — G8929 Other chronic pain: Secondary | ICD-10-CM

## 2012-01-20 LAB — CBC
HCT: 40.6 % (ref 39.0–52.0)
Hemoglobin: 13.8 g/dL (ref 13.0–17.0)
MCHC: 34 g/dL (ref 30.0–36.0)
MCV: 86.8 fL (ref 78.0–100.0)

## 2012-01-20 LAB — BASIC METABOLIC PANEL
BUN: 11 mg/dL (ref 6–23)
Creatinine, Ser: 1.15 mg/dL (ref 0.50–1.35)
GFR calc non Af Amer: 69 mL/min — ABNORMAL LOW (ref >90)
Glucose, Bld: 83 mg/dL (ref 70–99)
Potassium: 3.9 mEq/L (ref 3.5–5.1)

## 2012-01-20 MED ORDER — METRONIDAZOLE 500 MG PO TABS: 500.0000 mg | ORAL_TABLET | Freq: Three times a day (TID) | ORAL | Status: DC

## 2012-01-20 MED ORDER — CYCLOBENZAPRINE HCL 10 MG PO TABS: 10.0000 mg | ORAL_TABLET | Freq: Three times a day (TID) | ORAL | Status: DC

## 2012-01-20 MED ORDER — CYCLOBENZAPRINE HCL 10 MG PO TABS
10.0000 mg | ORAL_TABLET | Freq: Three times a day (TID) | ORAL | Status: DC
  Administered 2012-01-20: 10 mg via ORAL
  Filled 2012-01-20 (×3): qty 1

## 2012-01-20 NOTE — Discharge Summary (Signed)
See full note.cdm 

## 2012-01-20 NOTE — Discharge Summary (Signed)
Discharge Summary   Patient ID: Victor Mathis,  MRN: [id number], DOB/AGE: 1956/06/25 56 y.o.  Admit date: 01/18/2012 Discharge date: 01/20/2012  Discharge Diagnoses Principal Problem:  *Chest pain  - Concerning for [place] initially  - Heparinized and transferred to [facility] from [facility]  - Cardiac biomarker trend   01/18/2012 14:36 01/18/2012 18:36 01/19/2012 00:13 01/19/2012 07:23  CK, MB  2.3 2.2 1.6  CK Total  92 83 73  Troponin I <0.30 <0.30 <0.30 <0.30    - Cardiac cath 01/19/12: as below, no angiographic evidence of CAD; LVEF preserved   - Attributed to chronic pain/muscle spasms  - To resume outpatient meds, trial of Flexeril, establish and follow-up with PCP; no further ischemic work-up  Active Problems:  Mixed hyperlipidemia  - Lipid panel 01/19/12:   01/19/2012 07:23  Cholesterol 159  Triglycerides 66  HDL 41  LDL (calc) 105 (H)  VLDL 13  Total CHOL/HDL Ratio 3.9    - Continue statin   Chronic pain  - Suspected etiology of chest pain given normal cath and ACS rule-out   Muscle spasm  - Prescribed 5-day trial of Flexeril   - Further evaluation/management per PCP   C. difficile diarrhea  - Patient complained of diarrhea  - C Diff PCR: POSITIVE   - Started on Flagyl 500mg  TID; to complete 10-day course (day 2/10 today)  Allergies No Known Allergies  Diagnostic Studies/Procedures  Procedure Performed:  1. Left Heart Catheterization 2. Selective Coronary Angiography 3. Left ventricular angiogram Operator: Verne Carrow, MD  Arterial access site: Right radial artery.  Indication: Chest pain, negative cardiac markers. Cath arranged yesterday by Dr. Gala Romney.  Procedure Details:  The risks, benefits, complications, treatment options, and expected outcomes were discussed with the patient. The patient and/or family concurred with the proposed plan, giving informed consent. The patient was brought to the cath lab after IV hydration was begun and oral premedication  was given. The patient was further sedated with Versed and Fentanyl. The right wrist was assessed with an Allens test which was positive. The right wrist was prepped and draped in a sterile fashion. 1% lidocaine was used for local anesthesia. Using the modified Seldinger access technique, a 5 French sheath was placed in the right radial artery. 3 mg Verapamil was given through the sheath. 3500 units IV heparin was given. Standard diagnostic catheters were used to perform selective coronary angiography. A pigtail catheter was used to perform a left ventricular angiogram. The sheath was removed from the right radial artery and a Terumo hemostasis band was applied at the arteriotomy site on the right wrist.  There were no immediate complications. The patient was taken to the recovery area in stable condition.  Hemodynamic Findings:  Central aortic pressure: 97/58  Left ventricular pressure: 99/2/2  Angiographic Findings:  Left main: No angiographic evidence of disease.  Left Anterior Descending Artery: No angiographic evidence of disease  Circumflex Artery: No angiographic evidence of disease  Right Coronary Artery: No angiographic evidence of disease  Left Ventricular Angiogram: LVEF 55-60%.  Impression:  1. No angiographic evidence of CAD  2. Normal LV systolic function  3. Non-cardiac chest pain  Recommendations: No further ischemic workup.  Complications: None. The patient tolerated the procedure well.   History of Present Illness  Victor Mathis is a 56yo AA male with PMHx significant for the above problem list who was transferred [facility] on 01/18/12 for chest pain concerning for unstable angina.   The patient  had followed-up several times with Dr. Diona Browner in the [facility] clinic with atypical chest pain believed to be representative of angina given abnormal standard GXT by ECG criteria. He was started on cardiac meds including ASA/BB/statin/NTG SL PRN and had been  doing well with this regimen. Chest pain remained stable until the morning of admission. He had completed heavy exertion the night prior (shoveling) without incident, however awoke early in the morning with chest pain radiating to jaw, arm and leg. This resolved, but recurred later in the afternoon prompting his ED presentation to [facility]. There, EKG and POC troponin-I were unremarkable for ischemia. CBC, BMET WNL. CXR without acute cardiopulmonary process. He was started on heparin gtt given his history and continued chest pain, and transferred to [facility]   He was admitted to CCU stepdown. The plan was made to pursue diagnostic cardiac catheterization in the morning. Heparin gtt was continue as well as outpatient meds. He was loaded with Brilinta. He remained NPO past midnight without acute events.  He was initially reluctant for cath, but after further discussion, agreed to proceed. He was informed, consented and prepped for the procedure the following morning. This is described in full above. Notably, there was no angiographic evidence of CAD and LV function was found to be preserved. He tolerated the procedure well without complications. He was transferred to telemetry in good condition.   Cardiac biomarkers were cycled and found to be WNL. He did complain of R sided weakness, neck, shoulder and face numbness. Upon further evaluation, these were described to be chronic issues. The patient did complain of diarrhea. A C diff PCR returned positive. He was started on a 10-day course of Flagyl.   Today, he was assessed by Dr. Clifton James, and found to be stable for discharge. He will resume prior outpatient medications. He will complete a 10-day course of Flagyl. He was discharged on a 5-day course of Flexeril for muscle spasms. His chest pain was believed to be attributed to chronic musculoskeletal pain. He will need to establish with a PCP and follow-up in 1-2 weeks regarding these  issues. From a cardiac perspective, it was noted that no further ischemic work-up is warranted. This information, including supplemental CDAD material, was clearly outlined in the discharge AVS.   Discharge Vitals:  Blood pressure 110/60, pulse 64, temperature 97.5 F (36.4 C), temperature source Oral, resp. rate 17, height 5\' 7"  (1.702 m), weight 66.4 kg (146 lb 6.2 oz), SpO2 95.00%.   Labs: Recent Labs  Basename 01/20/12 0700 01/19/12 0723   WBC 4.5 5.8   HGB 13.8 13.4   HCT 40.6 39.1   MCV 86.8 85.7   PLT 228 231    Lab 01/20/12 0700 01/19/12 0723 01/18/12 1436  NA 142 138 137  K 3.9 3.9 3.8  CL 107 103 101  CO2 24 23 25   BUN 11 12 13   CREATININE 1.15 1.07 1.16  CALCIUM 8.6 8.8 9.4  PROT -- -- --  BILITOT -- -- --  ALKPHOS -- -- --  ALT -- -- --  AST -- -- --  AMYLASE -- -- --  LIPASE -- -- --  GLUCOSE 83 88 76   Recent Labs  Basename 01/18/12 1836   HGBA1C 5.8*   Recent Labs  Basename 01/19/12 0723 01/19/12 0013 01/18/12 1836   CKTOTAL 73 83 92   CKMB 1.6 2.2 2.3   CKMBINDEX -- -- --   TROPONINI <0.30 <0.30 <0.30   Recent  Labs  [facility] 01/19/12 0723   CHOL 159   HDL 41   LDLCALC 105*   TRIG 66   CHOLHDL 3.9   LDLDIRECT --   Disposition:   Follow-up Information    Schedule an appointment as soon as possible for a visit in 1 week to follow up. (Please establish with a primary care provider and follow-up in 1-2 weeks after this hospitalization for diarrhea, muscle spasms and chest pain. )          Discharge Medications:  Medication List  As of 01/20/2012 11:15 AM   START taking these medications         cyclobenzaprine 10 MG tablet   Commonly known as: FLEXERIL   Take 1 tablet (10 mg total) by mouth 3 (three) times daily.      metroNIDAZOLE 500 MG tablet   Commonly known as: FLAGYL   Take 1 tablet (500 mg total) by mouth every 8 (eight) hours.         CONTINUE taking these medications         aspirin EC 81 MG tablet      metoprolol  succinate 25 MG 24 hr tablet   Commonly known as: TOPROL-XL      nitroGLYCERIN 0.4 MG SL tablet   Commonly known as: NITROSTAT      simethicone 80 MG chewable tablet   Commonly known as: MYLICON      simvastatin 20 MG tablet   Commonly known as: ZOCOR          Where to get your medications    These are the prescriptions that you need to pick up. We sent them to a specific pharmacy, so you will need to go there to get them.   [id number] - [place], [place] - [address]    [address] [place] [address]    Phone: [phone]        cyclobenzaprine 10 MG tablet   metroNIDAZOLE 500 MG tablet           Outstanding Labs/Studies: None  Duration of Discharge Encounter: Greater than 30 minutes including physician time.  Signed, R. Hurman Horn, PA-C 01/20/2012, 11:15 AM

## 2012-01-20 NOTE — Progress Notes (Addendum)
SUBJECTIVE: No chest pain. No SOB. No diarrhea last night. He does describe right neck spasm, right jaw pain, left neck pain, right leg pain. THese issues are chronic.   BP 108/66  Pulse 59  Temp 97.9 F (36.6 C) (Oral)  Resp 20  Ht 5\' 7"  (1.702 m)  Wt 146 lb 6.2 oz (66.4 kg)  BMI 22.93 kg/m2  SpO2 97%  Intake/Output Summary (Last 24 hours) at 01/20/12 0809 Last data filed at 01/19/12 1600  Gross per 24 hour  Intake   1058 ml  Output    850 ml  Net    208 ml    PHYSICAL EXAM General: Well developed, well nourished, in no acute distress. Alert and oriented x 3.  Psych:  Good affect, responds appropriately Neck: No JVD. No masses noted.  Lungs: Clear bilaterally with no wheezes or rhonci noted.  Heart: RRR with no murmurs noted. Abdomen: Bowel sounds are present. Soft, non-tender.  Extremities: No lower extremity edema.   LABS: Basic Metabolic Panel:  Basename 01/20/12 0700 01/19/12 0723  NA 142 138  K 3.9 3.9  CL 107 103  CO2 24 23  GLUCOSE 83 88  BUN 11 12  CREATININE 1.15 1.07  CALCIUM 8.6 8.8  MG -- --  PHOS -- --   CBC:  Basename 01/20/12 0700 01/19/12 0723 01/18/12 1436  WBC 4.5 5.8 --  NEUTROABS -- -- 4.7  HGB 13.8 13.4 --  HCT 40.6 39.1 --  MCV 86.8 85.7 --  PLT 228 231 --   Cardiac Enzymes:  Basename 01/19/12 0723 01/19/12 0013 01/18/12 1836  CKTOTAL 73 83 92  CKMB 1.6 2.2 2.3  CKMBINDEX -- -- --  TROPONINI <0.30 <0.30 <0.30   Fasting Lipid Panel:  Basename 01/19/12 0723  CHOL 159  HDL 41  LDLCALC 105*  TRIG 66  CHOLHDL 3.9  LDLDIRECT --    Current Meds:    . antiseptic oral rinse  15 mL Mouth Rinse BID  . aspirin  81 mg Oral Daily  . diazepam  5 mg Oral On Call  . fentaNYL      . heparin      . heparin      . lidocaine      . metoprolol succinate  25 mg Oral Daily  . metroNIDAZOLE  500 mg Oral Q8H  . midazolam      . nitroGLYCERIN      . simvastatin  20 mg Oral QHS  . sodium chloride  3 mL Intravenous Q12H  .  verapamil         ASSESSMENT AND PLAN:  1. Chest pain: Resolved. No evidence of CAD on cath yesterday. Non-cardiac pain.   2. Diarrhea:  Pt with diarrhea and testing confirms C. Diff. Started on Flagyl yesterday.  Will need 10 days of Flagyl at current dosing.   3. Jaw pain: He has chronic neck and jaw pain. He did not have new symptoms after his cath yesterday. He has been having these issues since October 2012. No tooth infections that he is aware of. Also has pain in right leg and really diffusely throughout his chest. Will start Flexeril for short course for possible muscle spasms in neck/jaw. He will need to see a primary care doctor for further evaluation if this continues.   4. Dispo: D/C home today. No cardiac follow up is necessary. He will need to establish with primary care. Continue home meds. Flexeril 10 mg po TID with 15 pills,  no refills.   Armya Westerhoff  6/14/20138:09 AM

## 2012-01-20 NOTE — Discharge Instructions (Signed)
PLEASE REMEMBER TO BRING ALL OF YOUR MEDICATIONS TO EACH OF YOUR FOLLOW-UP OFFICE VISITS.  Activity: Increase activity slowly as tolerated. You may shower, but no soaking baths for 6 days. No driving for 1 day. No lifting over 5 lbs for 6 days. No sexual activity for 6 days.   You May Return to Work: in 1 week (if applicable)  Wound Care: You may wash cath site gently with soap and water. Keep cath site clean and dry. If you notice pain, swelling, bleeding or pus at your cath site, please call [phone].  Clostridium Difficile Infection  Clostridium difficile (C. diff) is a germ found in the intestines. C. diff infection can occur after taking antibiotic medicines. C. diff infection can cause watery poop (diarrhea) or severe disease.  HOME CARE  Drink enough fluids to keep your pee (urine) clear or pale yellow. Avoid milk, caffeine, and alcohol.  Ask your doctor how to replace body fluid losses (rehydrate).  Eat small meals more often rather than large meals.  Take your medicine (antibiotics) as told. Finish it even if you start to feel better.  Do not use medicines to slow the watery poop.  Wash your hands well after using the bathroom and before preparing food.  Make sure people who live with you wash their hands often.  GET HELP RIGHT AWAY IF:  The watery poop does not stop, or it comes back after you finish your medicine.  You feel very dry or thirsty (dehydrated).  You have a fever.  You have more belly (abdominal) pain or tenderness.  There is blood in your poop (stool), or your poop is black and tar-like.  You cannot eat food or drink liquids without throwing up (vomiting).  MAKE SURE YOU:  Understand these instructions.  Will watch your condition.  Will get help right away if you are not doing well or get worse.  Document Released: 05/22/2009 Document Revised: 07/14/2011 Document Reviewed: 12/31/2010  [facility] Patient Information 2012 [place], [place].

## 2012-01-20 NOTE — Progress Notes (Signed)
Pt discharged to home per MD order. Pt received all discharge instructions and medication information including follow--up and prescription information.  Pt alert and oriented at discharge with no complaints of chest pain. Efraim Kaufmann

## 2012-01-31 ENCOUNTER — Encounter: Payer: Self-pay | Admitting: Adult Health

## 2012-01-31 ENCOUNTER — Ambulatory Visit (INDEPENDENT_AMBULATORY_CARE_PROVIDER_SITE_OTHER): Payer: Self-pay | Admitting: Adult Health

## 2012-01-31 VITALS — BP 114/70 | HR 75 | Resp 18 | Ht 67.0 in | Wt 149.0 lb

## 2012-01-31 DIAGNOSIS — R079 Chest pain, unspecified: Secondary | ICD-10-CM

## 2012-01-31 NOTE — Patient Instructions (Signed)
Your physician recommends that you schedule a follow-up appointment in: prn  

## 2012-01-31 NOTE — Assessment & Plan Note (Signed)
No evidence of CAD per cardiac cath dated 01/20/2012. He will be seen on prn basis. He is advised to seek out a PCP for chronic medical issues.

## 2012-01-31 NOTE — Progress Notes (Signed)
   HPI: Victor Mathis is a 56 y/o patient of Dr. Diona Browner we are seeing on follow up after admission for chest pain with subsequent cardiac catheterization demonstrating no angiographic evidence of CAD and normal LV fx on 01/20/2012. It was believed to be musculoskeletal in origin. He was treated with flexeril and found relief. He has had some recurrence of chest discomfort which was relieved with mylanta.   No Known Allergies  Current Outpatient Prescriptions  Medication Sig Dispense Refill  . aspirin EC 81 MG tablet Take 162 mg by mouth every 6 (six) hours as needed. For pain      . metoprolol succinate (TOPROL-XL) 25 MG 24 hr tablet Take 25 mg by mouth daily.      . nitroGLYCERIN (NITROSTAT) 0.4 MG SL tablet Place 0.4 mg under the tongue every 5 (five) minutes as needed. For chest pain      . simethicone (MYLICON) 80 MG chewable tablet Chew 80 mg by mouth every 6 (six) hours as needed. For gas pain      . simvastatin (ZOCOR) 20 MG tablet Take 20 mg by mouth at bedtime.        Past Medical History  Diagnosis Date  . Coronary atherosclerosis of native coronary artery     Based on abnormal GXT  . Hyperlipidemia     Past Surgical History  Procedure Date  . Left eye surgery 1986    [place]    UJW:JXBJYN of systems complete and found to be negative unless listed above  PHYSICAL EXAM BP 114/70  Pulse 75  Resp 18  Ht 5\' 7"  (1.702 m)  Wt 149 lb (67.586 kg)  BMI 23.34 kg/m2  General: Well developed, well nourished, in no acute distress Head: Eyes PERRLA, No xanthomas.   Normal cephalic and atramatic  Lungs: Clear bilaterally to auscultation and percussion. Heart: HRRR S1 S2, without MRG.  Pulses are 2+ & equal.            No carotid bruit. No JVD.  No abdominal bruits. No femoral bruits. Abdomen: Bowel sounds are positive, abdomen soft and non-tender without masses or                  Hernia's noted. Msk:  Back normal, normal gait. Normal strength and tone for age. Extremities:  No clubbing, cyanosis or edema.  DP +1 Right wrist cath insertion site is healthy without evidence of bleeding or hematoma. Neuro: Alert and oriented X 3. Psych:  Good affect, responds appropriately    ASSESSMENT AND PLAN

## 2012-02-21 ENCOUNTER — Emergency Department: Payer: Self-pay | Admitting: Emergency Medicine

## 2012-02-21 LAB — LIPASE, BLOOD: Lipase: 116 U/L (ref 73–393)

## 2012-02-21 LAB — URINALYSIS, COMPLETE
Bacteria: NONE SEEN
Blood: NEGATIVE
Leukocyte Esterase: NEGATIVE
Nitrite: NEGATIVE
Ph: 7 (ref 4.5–8.0)
Protein: NEGATIVE
Specific Gravity: 1.001 (ref 1.003–1.030)

## 2012-02-21 LAB — CBC
HCT: 47.4 % (ref 40.0–52.0)
HGB: 15.1 g/dL (ref 13.0–18.0)
RBC: 5.26 10*6/uL (ref 4.40–5.90)

## 2012-02-21 LAB — COMPREHENSIVE METABOLIC PANEL
Albumin: 3.9 g/dL (ref 3.4–5.0)
Alkaline Phosphatase: 90 U/L (ref 50–136)
Anion Gap: 10 (ref 7–16)
Bilirubin,Total: 0.3 mg/dL (ref 0.2–1.0)
Calcium, Total: 9.1 mg/dL (ref 8.5–10.1)
EGFR (African American): >60
Glucose: 86 mg/dL (ref 65–99)
Osmolality: 280 (ref 275–301)
SGOT(AST): 25 U/L (ref 15–37)
Sodium: 141 mmol/L (ref 136–145)
Total Protein: 8.2 g/dL (ref 6.4–8.2)

## 2012-02-22 ENCOUNTER — Encounter (HOSPITAL_COMMUNITY): Payer: Self-pay | Admitting: Emergency Medicine

## 2012-02-22 ENCOUNTER — Emergency Department (HOSPITAL_COMMUNITY)
Admission: EM | Admit: 2012-02-22 | Discharge: 2012-02-22 | Disposition: A | Discharge status: Home / Self Care | Payer: Medicaid Other | Attending: Emergency Medicine | Admitting: Emergency Medicine

## 2012-02-22 ENCOUNTER — Emergency Department (HOSPITAL_COMMUNITY): Payer: Medicaid Other

## 2012-02-22 DIAGNOSIS — R0600 Dyspnea, unspecified: Secondary | ICD-10-CM

## 2012-02-22 DIAGNOSIS — R0989 Other specified symptoms and signs involving the circulatory and respiratory systems: Secondary | ICD-10-CM | POA: Insufficient documentation

## 2012-02-22 DIAGNOSIS — R0609 Other forms of dyspnea: Secondary | ICD-10-CM | POA: Insufficient documentation

## 2012-02-22 DIAGNOSIS — K625 Hemorrhage of anus and rectum: Secondary | ICD-10-CM | POA: Insufficient documentation

## 2012-02-22 DIAGNOSIS — E785 Hyperlipidemia, unspecified: Secondary | ICD-10-CM | POA: Insufficient documentation

## 2012-02-22 DIAGNOSIS — R079 Chest pain, unspecified: Secondary | ICD-10-CM | POA: Insufficient documentation

## 2012-02-22 DIAGNOSIS — R0602 Shortness of breath: Secondary | ICD-10-CM | POA: Insufficient documentation

## 2012-02-22 DIAGNOSIS — R42 Dizziness and giddiness: Secondary | ICD-10-CM | POA: Insufficient documentation

## 2012-02-22 DIAGNOSIS — Z79899 Other long term (current) drug therapy: Secondary | ICD-10-CM | POA: Insufficient documentation

## 2012-02-22 LAB — BASIC METABOLIC PANEL
BUN: 8 mg/dL (ref 6–23)
Calcium: 9.8 mg/dL (ref 8.4–10.5)
Creatinine, Ser: 1.03 mg/dL (ref 0.50–1.35)
GFR calc non Af Amer: 79 mL/min — ABNORMAL LOW (ref >90)
Glucose, Bld: 102 mg/dL — ABNORMAL HIGH (ref 70–99)

## 2012-02-22 LAB — PRO B NATRIURETIC PEPTIDE: Pro B Natriuretic peptide (BNP): 20.8 pg/mL (ref 0–125)

## 2012-02-22 LAB — D-DIMER, QUANTITATIVE: D-Dimer, Quant: 0.33 ug/mL-FEU (ref 0.00–0.48)

## 2012-02-22 LAB — TROPONIN I: Troponin I: <0.3 ng/mL (ref <0.30)

## 2012-02-22 LAB — CBC
HCT: 45.5 % (ref 39.0–52.0)
Hemoglobin: 15.6 g/dL (ref 13.0–17.0)
MCH: 29.7 pg (ref 26.0–34.0)
MCHC: 34.3 g/dL (ref 30.0–36.0)
MCV: 86.5 fL (ref 78.0–100.0)

## 2012-02-22 MED ORDER — SODIUM CHLORIDE 0.9 % IV BOLUS (SEPSIS)
1000.0000 mL | Freq: Once | INTRAVENOUS | Status: AC
  Administered 2012-02-22: 1000 mL via INTRAVENOUS

## 2012-02-22 NOTE — ED Notes (Signed)
Pt respirations even and non-labored at a rate of 22.

## 2012-02-22 NOTE — ED Provider Notes (Signed)
History     CSN: [id number]  Arrival date & time 02/22/12  1329   First MD Initiated Contact with Patient 02/22/12 1516      Chief Complaint  Patient presents with  . Rectal Bleeding  . Shortness of Breath    (Consider location/radiation/quality/duration/timing/severity/associated sxs/prior treatment) HPI Comments: Patient presents with multiple complaints today.  Patient initially complains of some bright red blood per rectum for the last 2 days.  He notes it only occurs with bowel movements.  He notes blood mixed with his stools and noted some bloody toilet water.  He denies any specific abdominal pain.  No prior GI bleeding per his history.  Patient states that he has been on aspirin but is on no other anticoagulation or antiplatelet agents.  He's had no recent surgeries.  He's had no nausea or vomiting.  Patient does note that he had diarrhea approximately a month ago in the medical record review I did find that the patient was C. difficile positive a month ago.  He was treated with Flagyl for this.  Patient had noted some more frequent bowel movements yesterday but has not noted them today.  He denies any fevers.  Patient secondarily has noted some shortness of breath over the last day.  He also had some chest pain earlier but he has no chest pain at this time.  He denies any cough.  The chest pain is not pleuritic.  There is no new leg swelling or pain.  No prior history of blood clots.  Patient denies a smoking history or any asthma or COPD history.  The history is provided by the patient. No language interpreter was used.    Past Medical History  Diagnosis Date  . Coronary atherosclerosis of native coronary artery     Based on abnormal GXT  . Hyperlipidemia     Past Surgical History  Procedure Date  . Left eye surgery 1986    [place]    Family History  Problem Relation Age of Onset  . Diabetes type II Father   . Dementia Mother     History  Substance Use Topics  .  Smoking status: Never Smoker   . Smokeless tobacco: Not on file  . Alcohol Use: Yes     Occasional beer      Review of Systems  Constitutional: Negative.  Negative for fever and chills.  HENT: Negative.   Eyes: Negative.  Negative for discharge and redness.  Respiratory: Positive for shortness of breath. Negative for cough.   Cardiovascular: Positive for chest pain. Negative for leg swelling.  Gastrointestinal: Positive for diarrhea and blood in stool. Negative for nausea, vomiting and abdominal pain.  Genitourinary: Negative.  Negative for hematuria.  Musculoskeletal: Negative.  Negative for back pain.  Skin: Negative.  Negative for color change and rash.  Neurological: Negative for syncope and headaches.  Hematological: Negative.  Negative for adenopathy.  Psychiatric/Behavioral: Negative.  Negative for confusion.  All other systems reviewed and are negative.    Allergies  Review of patient's allergies indicates no known allergies.  Home Medications   Current Outpatient Rx  Name Route Sig Dispense Refill  . METOPROLOL SUCCINATE ER 25 MG PO TB24 Oral Take 25 mg by mouth daily.    Marland Kitchen NITROGLYCERIN 0.4 MG SL SUBL Sublingual Place 0.4 mg under the tongue every 5 (five) minutes as needed. For chest pain    . SIMETHICONE 80 MG PO CHEW Oral Chew 80 mg by mouth every 6 (six)  hours as needed. For gas pain    . SIMVASTATIN 20 MG PO TABS Oral Take 20 mg by mouth at bedtime.    . ASPIRIN EC 81 MG PO TBEC Oral Take 162 mg by mouth every 6 (six) hours as needed. For pain      BP 110/77  Pulse 105  Temp 97 F (36.1 C) (Oral)  Resp 24  SpO2 100%  Physical Exam  Nursing note and vitals reviewed. Constitutional: He is oriented to person, place, and time. He appears well-developed and well-nourished.  Non-toxic appearance. He does not have a sickly appearance.  HENT:  Head: Normocephalic and atraumatic.  Eyes: Conjunctivae, EOM and lids are normal. Pupils are equal, round, and  reactive to light.  Neck: Trachea normal, normal range of motion and full passive range of motion without pain. Neck supple.  Cardiovascular: Normal rate, regular rhythm and normal heart sounds.  Exam reveals no gallop and no friction rub.   No murmur heard. Pulmonary/Chest: Breath sounds normal. No respiratory distress. He has no wheezes. He has no rales.       Patient appeared tachypneic on my exam  Abdominal: Soft. Normal appearance. He exhibits no distension. There is no tenderness. There is no rebound and no CVA tenderness.  Genitourinary: Rectum normal.       Normal rectal tone.  No significant stool in vault.  No blood or melena noted on exam.  Musculoskeletal: Normal range of motion. He exhibits no edema.  Neurological: He is alert and oriented to person, place, and time. He has normal strength.  Skin: Skin is warm, dry and intact. No rash noted.  Psychiatric: He has a normal mood and affect. His behavior is normal. Judgment and thought content normal.    ED Course  Procedures (including critical care time)  Labs Reviewed  BASIC METABOLIC PANEL - Abnormal; Notable for the following:    Glucose, Bld 102 (*)     GFR calc non Af Amer 79 (*)     All other components within normal limits  CBC  PROTIME-INR  SAMPLE TO BLOOD BANK   Dg Chest 2 View  02/22/2012  *RADIOLOGY REPORT*  Clinical Data: Shortness of breath, dizziness  CHEST - 2 VIEW  Comparison: Portable chest x-ray of 01/18/2012  Findings: The lungs are clear and slightly hyperaerated.  Minimal peribronchial thickening is present.  Mediastinal contours appear normal.  The heart is within normal limits in size.  No bony abnormality is seen.  IMPRESSION: No active lung disease.  Slight hyperaeration.  Original Report Authenticated By: Juline Patch, M.D.     No diagnosis found.   Date: 02/22/2012  Rate: 112  Rhythm: sinus tachycardia  QRS Axis: normal  Intervals: normal  ST/T Wave abnormalities: nonspecific T wave  changes  Conduction Disutrbances:none  Narrative Interpretation:   Old EKG Reviewed: changes noted from 01-20-12 with NSR, with no T wave changes    MDM  Patient has no acute ischemic changes on his EKG.  On medical record reviewed patient did have a cardiac catheterization completed in June of 2013 which showed normal coronary arteries and a normal ejection fraction of 55-60%.  It is unlikely that his shortness of breath is related to  ACS given his recent normal cardiac catheterization.  Patient has a normal chest x-ray at this time.  His lungs are clear did not demonstrate any wheezing to suggest the development of an allergic reaction or asthma.  Patient has no specific symptoms to suggest bronchitis  or pneumonia.  Patient is not anemic on his CBC to suggest significant signs of bleeding that would lead him to be short of breath.  He has a normal troponin and BNP.  I have added a d-dimer as the patient be low risk for pulmonary embolus but he did demonstrate the tachypnea here.    Regarding the patient's rectal bleeding.  He did not demonstrate any bleeding on my exam at this time.  He has a stable and normal hemoglobin of 15.  I believe this can be followed up as an outpatient by a GI physician.  Patient also has a primary care physician.        Nat Christen, MD 02/22/12 1725

## 2012-02-22 NOTE — ED Notes (Signed)
Pt c/o rectal bleeding x 4 days with dizziness and SOB; pt sts BRB in toilet; pt denies being on blood thinners except ASA which has not taken in over a week; pt sts went to [facility] last night for same and discharged; pt sts not taking heart meds x 1 week

## 2012-02-23 ENCOUNTER — Encounter: Payer: Self-pay | Admitting: Gastroenterology

## 2012-03-12 ENCOUNTER — Other Ambulatory Visit: Payer: Self-pay | Admitting: *Deleted

## 2012-03-12 DIAGNOSIS — Z13220 Encounter for screening for lipoid disorders: Secondary | ICD-10-CM

## 2012-03-21 ENCOUNTER — Ambulatory Visit: Payer: Self-pay | Admitting: Gastroenterology

## 2012-04-20 ENCOUNTER — Encounter: Payer: Self-pay | Admitting: Cardiology

## 2012-04-20 ENCOUNTER — Ambulatory Visit (INDEPENDENT_AMBULATORY_CARE_PROVIDER_SITE_OTHER): Payer: Self-pay | Admitting: Cardiology

## 2012-04-20 VITALS — BP 118/80 | HR 57 | Wt 146.0 lb

## 2012-04-20 DIAGNOSIS — R079 Chest pain, unspecified: Secondary | ICD-10-CM

## 2012-04-20 NOTE — Assessment & Plan Note (Signed)
Noncardiac, with cardiac catheterization from June demonstrating no significant CAD. Patient is due to see a gastroenterologist next week. He can likely go ahead and stop aspirin, should not require nitroglycerin. Otherwise recommend general risk factor modification strategies, probably reasonable to continue addressing lipid status. He can followup through the Health Department as before. His visits can be p.r.n.

## 2012-04-20 NOTE — Patient Instructions (Addendum)
Your physician recommends that you schedule a follow-up appointment in: As needed  May stop aspirin   Follow up with health department for primary care needs

## 2012-04-20 NOTE — Progress Notes (Signed)
   Clinical Summary Victor Mathis is a 56 y.o.male presenting for a previously scheduled office followup. Since I last saw Victor Mathis, Victor Mathis underwent cardiac catheterization in June that demonstrated no significant CAD. Victor Mathis had had a previous treadmill test was abnormal suggesting the possibility of ischemic heart disease. At this point it is felt that his chest pain symptoms are possibly musculoskeletal and/or related to reflux.  Victor Mathis reports no progression in symptoms. I reviewed the results of his cardiac catheterization with Victor Mathis again today. At this point Victor Mathis can likely stop aspirin, should not require nitroglycerin. Probably reasonable to continue with risk factor modification however including treatment of his lipids. Victor Mathis tells me that Victor Mathis is due to see a gastroenterologist next week. His primary care is through the [facility].   No Known Allergies  Current Outpatient Prescriptions  Medication Sig Dispense Refill  . aspirin EC 81 MG tablet Take 162 mg by mouth every 6 (six) hours as needed. For pain      . metoprolol succinate (TOPROL-XL) 25 MG 24 hr tablet Take 25 mg by mouth daily.      . nitroGLYCERIN (NITROSTAT) 0.4 MG SL tablet Place 0.4 mg under the tongue every 5 (five) minutes as needed. For chest pain      . simethicone (MYLICON) 80 MG chewable tablet Chew 80 mg by mouth every 6 (six) hours as needed. For gas pain      . simvastatin (ZOCOR) 20 MG tablet Take 20 mg by mouth at bedtime.        Past Medical History  Diagnosis Date  . History of cardiac catheterization     No significant CAD 6/13  . Hyperlipidemia     Social History Mr. Mazzocchi reports that Victor Mathis has never smoked. Victor Mathis does not have any smokeless tobacco history on file. Mr. Caperton reports that Victor Mathis drinks alcohol.  Review of Systems No palpitations or syncope. States his chest pain is sometimes worse when Victor Mathis tries to sleep on his left side. Stable appetite. Otherwise negative.  Physical Examination Filed  Vitals:   04/20/12 0836  BP: 118/80  Pulse: 57   Filed Weights   04/20/12 0836  Weight: 146 lb (66.225 kg)    Normally nourished appearing male in no acute distress.  HEENT: Conjunctiva and lids are normal, oropharynx with moist mucosa.  Neck: Supple, no elevated JVP or bruits, no thyromegaly.  Lungs: Clear to auscultation, nonlabored.  Cardiac: Regular rate and rhythm, no significant murmur rub or gallop.  Abdomen: Soft, nontender, bowel sounds present.  Skin: Warm and dry.  Extremities: No pitting edema, distal pulses full.  Musculoskeletal: No kyphosis.  Neuropsychiatric: Alert and oriented x3, affect appropriate.   Problem List and Plan   Chest pain Noncardiac, with cardiac catheterization from June demonstrating no significant CAD. Patient is due to see a gastroenterologist next week. Victor Mathis can likely go ahead and stop aspirin, should not require nitroglycerin. Otherwise recommend general risk factor modification strategies, probably reasonable to continue addressing lipid status. Victor Mathis can followup through the Health Department as before. His visits can be p.r.n.    Jonelle Sidle, M.D., F.A.C.C.

## 2012-04-23 ENCOUNTER — Ambulatory Visit: Payer: Self-pay | Admitting: Gastroenterology

## 2012-05-18 ENCOUNTER — Ambulatory Visit (INDEPENDENT_AMBULATORY_CARE_PROVIDER_SITE_OTHER): Payer: Self-pay | Admitting: Gastroenterology

## 2012-05-18 ENCOUNTER — Encounter: Payer: Self-pay | Admitting: Gastroenterology

## 2012-05-18 VITALS — BP 100/74 | HR 68 | Ht 70.0 in | Wt 148.6 lb

## 2012-05-18 DIAGNOSIS — K625 Hemorrhage of anus and rectum: Secondary | ICD-10-CM

## 2012-05-18 DIAGNOSIS — R079 Chest pain, unspecified: Secondary | ICD-10-CM

## 2012-05-18 MED ORDER — PEG-KCL-NACL-NASULF-NA ASC-C 100 G PO SOLR: 1.0000 | Freq: Once | ORAL | Status: DC

## 2012-05-18 NOTE — Progress Notes (Signed)
HPI: This is a    very pleasant 56 year old man who is here with a male friend in the room today.  Went to hospital and had cardiac testing, was told his heart was OK.    He feels squeezing in left chest since October of 2012.   Exertion will make this worse.    He and his wife say he DOES have heart problems however I reviewed note from cardiologist from last month.  Was advised that he could stop nitroglycerin and aspirin.  He occasionally gets pyrosis.  Takes tums for it periodically.  Overall weight is down 6 pounds in past 12 months.  Never had EGD or colonoscopy.  HE has constipation at times, diarrhea at times.  Had c. Difficile PCR + when in June 2013 hospitalization.  Takes goody powders, advil.  Takes 2-3 goodys a day.  Takes advil 2 pill per day.  Takes these for chest pains.  Review of systems: Pertinent positive and negative review of systems were noted in the above HPI section. Complete review of systems was performed and was otherwise normal.    Past Medical History  Diagnosis Date  . History of cardiac catheterization     No significant CAD 6/13  . Hyperlipidemia     Past Surgical History  Procedure Date  . Left eye surgery 1986    [place]    Current Outpatient Prescriptions  Medication Sig Dispense Refill  . metoprolol succinate (TOPROL-XL) 25 MG 24 hr tablet Take 25 mg by mouth daily.      . nitroGLYCERIN (NITROSTAT) 0.4 MG SL tablet Place 0.4 mg under the tongue every 5 (five) minutes as needed. For chest pain      . simethicone (MYLICON) 80 MG chewable tablet Chew 80 mg by mouth every 6 (six) hours as needed. For gas pain      . simvastatin (ZOCOR) 20 MG tablet Take 20 mg by mouth at bedtime.      Marland Kitchen aspirin EC 81 MG tablet Take 162 mg by mouth every 6 (six) hours as needed. For pain        Allergies as of 05/18/2012  . (No Known Allergies)    Family History  Problem Relation Age of Onset  . Diabetes type II Father   . Dementia Mother      History   Social History  . Marital Status: Single    Spouse Name: N/A    Number of Children: 1  . Years of Education: N/A   Occupational History  . Unemployed    Social History Main Topics  . Smoking status: Never Smoker   . Smokeless tobacco: Never Used  . Alcohol Use: Yes     Occasional beer  . Drug Use: No  . Sexually Active: Not on file   Other Topics Concern  . Not on file   Social History Narrative  . No narrative on file       Physical Exam: BP 100/74  Pulse 68  Ht 5\' 10"  (1.778 m)  Wt 148 lb 9.6 oz (67.405 kg)  BMI 21.32 kg/m2 Constitutional: generally well-appearing Psychiatric: alert and oriented x3 Eyes: extraocular movements intact Mouth: oral pharynx moist, no lesions Neck: supple no lymphadenopathy Cardiovascular: heart regular rate and rhythm Lungs: clear to auscultation bilaterally Abdomen: soft, nontender, nondistended, no obvious ascites, no peritoneal signs, normal bowel sounds Extremities: no lower extremity edema bilaterally Skin: no lesions on visible extremities    Assessment and plan: 56 y.o. male with  intermittent chest pains, intermittent epigastric pains, minor rectal bleeding.   I did not mention above that CBC recently showed a normal hemoglobin. At first he and his friends were telling me very clearly that he has significant heart disease requiring nitroglycerin. I was able to access his cardiologist's recent office note and read to him the fact that the cardiologist believes he has no significant cardiac problem and in fact he recommended he stop nitroglycerin and stop aspirin as well. He does have intermittent chest pains and these could be gastrointestinal related, perhaps GERD related. He takes quite a lot of NSAIDs on a daily basis and this can cause stomach upset, stomach ulcers. I recommended he refrain from so much NSAIDs and he will start taking it once daily and he asked the medicine, proton pump inhibitor  over-the-counter. We'll proceed with EGD for his dyspepsia, chest pains. The same time we'll proceed with colonoscopy given some minor recent rectal bleeding. He has never had a colonoscopy before for colon cancer screening either.

## 2012-05-18 NOTE — Patient Instructions (Addendum)
Goody's Powders and Advil are NSAID type medicine. These can cause gi ulcers and you should try to refrain from taking them as  Best as possible. Start taking on omeprazole pill every morning, this is an antiacid medicine medicine and can help your stomach problems. You will be set up for a colonoscopy (for minor rectal bleeding). You will be set up for an upper endoscopy for chest pains, epigatric pains (LEC with MAC).

## 2012-05-29 ENCOUNTER — Encounter (HOSPITAL_COMMUNITY): Payer: Self-pay

## 2012-05-29 ENCOUNTER — Emergency Department (HOSPITAL_COMMUNITY): Payer: Medicaid Other

## 2012-05-29 ENCOUNTER — Emergency Department (HOSPITAL_COMMUNITY)
Admission: EM | Admit: 2012-05-29 | Discharge: 2012-05-29 | Disposition: A | Discharge status: Home / Self Care | Payer: Medicaid Other | Attending: Emergency Medicine | Admitting: Emergency Medicine

## 2012-05-29 DIAGNOSIS — Z8679 Personal history of other diseases of the circulatory system: Secondary | ICD-10-CM | POA: Insufficient documentation

## 2012-05-29 DIAGNOSIS — E785 Hyperlipidemia, unspecified: Secondary | ICD-10-CM | POA: Insufficient documentation

## 2012-05-29 DIAGNOSIS — R1013 Epigastric pain: Secondary | ICD-10-CM | POA: Insufficient documentation

## 2012-05-29 DIAGNOSIS — R1012 Left upper quadrant pain: Secondary | ICD-10-CM | POA: Insufficient documentation

## 2012-05-29 DIAGNOSIS — K5730 Diverticulosis of large intestine without perforation or abscess without bleeding: Secondary | ICD-10-CM | POA: Insufficient documentation

## 2012-05-29 DIAGNOSIS — R112 Nausea with vomiting, unspecified: Secondary | ICD-10-CM | POA: Insufficient documentation

## 2012-05-29 DIAGNOSIS — R109 Unspecified abdominal pain: Secondary | ICD-10-CM

## 2012-05-29 DIAGNOSIS — Z79899 Other long term (current) drug therapy: Secondary | ICD-10-CM | POA: Insufficient documentation

## 2012-05-29 LAB — HEPATIC FUNCTION PANEL
Albumin: 4.3 g/dL (ref 3.5–5.2)
Alkaline Phosphatase: 73 U/L (ref 39–117)
Total Bilirubin: 0.8 mg/dL (ref 0.3–1.2)

## 2012-05-29 LAB — CBC WITH DIFFERENTIAL/PLATELET
Basophils Relative: 0 % (ref 0–1)
Hemoglobin: 14.3 g/dL (ref 13.0–17.0)
Lymphs Abs: 2.4 10*3/uL (ref 0.7–4.0)
Monocytes Relative: 7 % (ref 3–12)
Neutro Abs: 9.4 10*3/uL — ABNORMAL HIGH (ref 1.7–7.7)
Neutrophils Relative %: 74 % (ref 43–77)
Platelets: 247 10*3/uL (ref 150–400)
RBC: 4.82 MIL/uL (ref 4.22–5.81)

## 2012-05-29 LAB — BASIC METABOLIC PANEL
BUN: 11 mg/dL (ref 6–23)
Chloride: 90 mEq/L — ABNORMAL LOW (ref 96–112)
GFR calc Af Amer: 87 mL/min — ABNORMAL LOW (ref >90)
GFR calc non Af Amer: 75 mL/min — ABNORMAL LOW (ref >90)
Glucose, Bld: 104 mg/dL — ABNORMAL HIGH (ref 70–99)
Potassium: 3.4 mEq/L — ABNORMAL LOW (ref 3.5–5.1)
Sodium: 127 mEq/L — ABNORMAL LOW (ref 135–145)

## 2012-05-29 MED ORDER — MORPHINE SULFATE 4 MG/ML IJ SOLN
4.0000 mg | Freq: Once | INTRAMUSCULAR | Status: AC
  Administered 2012-05-29: 4 mg via INTRAVENOUS
  Filled 2012-05-29: qty 1

## 2012-05-29 MED ORDER — PANTOPRAZOLE SODIUM 40 MG IV SOLR
40.0000 mg | Freq: Once | INTRAVENOUS | Status: AC
  Administered 2012-05-29: 40 mg via INTRAVENOUS
  Filled 2012-05-29: qty 40

## 2012-05-29 MED ORDER — PANTOPRAZOLE SODIUM 20 MG PO TBEC: DELAYED_RELEASE_TABLET | ORAL | Status: DC

## 2012-05-29 MED ORDER — OXYCODONE-ACETAMINOPHEN 5-325 MG PO TABS: 1.0000 | ORAL_TABLET | Freq: Four times a day (QID) | ORAL | Status: AC | PRN

## 2012-05-29 MED ORDER — IOHEXOL 300 MG/ML  SOLN
100.0000 mL | Freq: Once | INTRAMUSCULAR | Status: AC | PRN
  Administered 2012-05-29: 100 mL via INTRAVENOUS

## 2012-05-29 NOTE — ED Provider Notes (Signed)
History  This chart was scribed for Victor Lennert, MD by Bennett Scrape. This patient was seen in room APA11/APA11 and the patient's care was started at 3:25PM.  CSN: [id number]  Arrival date & time 05/29/12  1521   First MD Initiated Contact with Patient 05/29/12 1525      Chief Complaint  Patient presents with  . Chest Pain    Patient is a 56 y.o. male presenting with abdominal pain. The history is provided by the patient. No language interpreter was used.  Abdominal Pain The primary symptoms of the illness include abdominal pain, nausea and vomiting. The primary symptoms of the illness do not include fever or diarrhea. The current episode started 6 to 12 hours ago. The onset of the illness was gradual. The problem has been gradually worsening.  The abdominal pain is located in the epigastric region. The abdominal pain radiates to the chest.  Symptoms associated with the illness do not include hematuria, frequency or back pain. Significant associated medical issues do not include GERD, diabetes or diverticulitis.    Victor Mathis is a 56 y.o. male who presents to the Emergency Department complaining of 7 hours of gradual onset, gradually worsening, constant epigastric abdominal pain described as pressure that radiates into the left chest with associated nausea and 2 episodes of non-bloody emesis. He rates his pain an 8 out of 10 at it worst and an 8 out of 10 at its best. He denies having any modifying factors and denies taking OTC medications at home to improve symptoms. He reports prior less severe episodes and states that he has followed up with his Cardiologists with no diagnosis and has a follow up scheduled in 3 days with a GI specialist. He denies taking any prescribed pain medications. He denies having any prior abdominal surgeries.  He denies diaphoresis, SOB diarrhea and fevers as associated symptoms. He has a h/o HLD and denies smoking and alcohol use.  Pt sees [facility]  Past Medical History  Diagnosis Date  . History of cardiac catheterization     No significant CAD 6/13  . Hyperlipidemia     Past Surgical History  Procedure Date  . Left eye surgery 1986    [place]    Family History  Problem Relation Age of Onset  . Diabetes type II Father   . Dementia Mother     History  Substance Use Topics  . Smoking status: Never Smoker   . Smokeless tobacco: Never Used  . Alcohol Use: No     Occasional beer      Review of Systems  Constitutional: Negative for fever.  HENT: Negative for congestion, sinus pressure and ear discharge.   Eyes: Negative for discharge.  Gastrointestinal: Positive for nausea, vomiting and abdominal pain. Negative for diarrhea.  Genitourinary: Negative for frequency and hematuria.  Musculoskeletal: Negative for back pain.  Skin: Negative for rash.  Hematological: Negative.   Psychiatric/Behavioral: Negative for hallucinations.    Allergies  Review of patient's allergies indicates no known allergies.  Home Medications   Current Outpatient Rx  Name Route Sig Dispense Refill  . ASPIRIN EC 81 MG PO TBEC Oral Take 162 mg by mouth every 6 (six) hours as needed. For pain    . METOPROLOL SUCCINATE ER 25 MG PO TB24 Oral Take 25 mg by mouth daily.    Marland Kitchen NITROGLYCERIN 0.4 MG SL SUBL Sublingual Place 0.4 mg under the tongue every 5 (five) minutes as needed. For chest pain    .  PEG-KCL-NACL-NASULF-NA ASC-C 100 G PO SOLR Oral Take 1 kit (100 g total) by mouth once. 1 kit 0  . SIMETHICONE 80 MG PO CHEW Oral Chew 80 mg by mouth every 6 (six) hours as needed. For gas pain    . SIMVASTATIN 20 MG PO TABS Oral Take 20 mg by mouth at bedtime.      Triage Vitals: BP 128/80  Pulse 82  Temp 97.9 F (36.6 C) (Oral)  Resp 20  Ht 5\' 7"  (1.702 m)  Wt 150 lb (68.04 kg)  BMI 23.49 kg/m2  SpO2 100%  Physical Exam  Nursing note and vitals reviewed. Constitutional: He is oriented to person, place, and time. He  appears well-developed and well-nourished.  HENT:  Head: Normocephalic and atraumatic.  Eyes: Conjunctivae normal and EOM are normal. No scleral icterus.  Neck: Neck supple. No thyromegaly present.  Cardiovascular: Normal rate and regular rhythm.  Exam reveals no gallop and no friction rub.   No murmur heard. Pulmonary/Chest: Effort normal and breath sounds normal. No stridor. He has no wheezes. He has no rales. He exhibits no tenderness.  Abdominal: He exhibits no distension. There is tenderness (moderate tenderness in the epigastrium and LUQ). There is rebound.  Musculoskeletal: Normal range of motion. He exhibits no edema.  Lymphadenopathy:    He has no cervical adenopathy.  Neurological: He is oriented to person, place, and time. Coordination normal.  Skin: No rash noted. No erythema.  Psychiatric: He has a normal mood and affect. His behavior is normal.    ED Course  Procedures (including critical care time)  DIAGNOSTIC STUDIES: Oxygen Saturation is 100% on room ari, normal by my interpretation.    COORDINATION OF CARE: 3:39 PM-Discussed treatment plan which includes blood work and pain medications with pt at bedside and pt agreed to plan.  3:45PM-Ordered 40 mg injection of protonix and 4 mg injection of morphine  7:06PM-Pt rechecked and still c/o pain. He also c/o sharp heart pangs every 30 minutes which he has already been evaluated for. Discussed discharge plan which includes pain medications, follow up with his GI as planned with pt and pt agreed to plan. Advised pt to not take any OTC medications but Tylenol until approved by the GI specialist.  Labs Reviewed  CBC WITH DIFFERENTIAL - Abnormal; Notable for the following:    WBC 12.7 (*)     Neutro Abs 9.4 (*)     All other components within normal limits  BASIC METABOLIC PANEL - Abnormal; Notable for the following:    Sodium 127 (*)     Potassium 3.4 (*)     Chloride 90 (*)     Glucose, Bld 104 (*)     GFR calc non Af  Amer 75 (*)     GFR calc Af Amer 87 (*)     All other components within normal limits  TROPONIN I  LIPASE, BLOOD  HEPATIC FUNCTION PANEL   Ct Abdomen Pelvis W Contrast  05/29/2012  *RADIOLOGY REPORT*  Clinical Data: Left upper quadrant and epigastric pain radiating into the chest.  CT ABDOMEN AND PELVIS WITH CONTRAST  Technique:  Multidetector CT imaging of the abdomen and pelvis was performed following the standard protocol during bolus administration of intravenous contrast.  Contrast: OMNIPAQUE IOHEXOL 300 MG/ML  SOLN  Comparison: Multiple exams, including 05/29/2012 and 05/16/2011  Findings: Dependent subsegmental atelectasis noted in the right lower lobe.  The liver, spleen, pancreas, and adrenal glands appear unremarkable.  The gallbladder and biliary  system appear unremarkable.  No biliary dilatation observed.  Mild fullness of both ureters noted without overt hydroureter.  Urinary bladder unremarkable. Appendix normal.  Sigmoid diverticulosis noted without active diverticulitis.  No dilated bowel noted. No pathologic retroperitoneal or porta hepatis adenopathy is identified.  Chronic endplate sclerosis noted at L4-5 with a Schmorl's node along the inferior endplate of L4.  IMPRESSION:  1.  Sigmoid diverticulosis without active diverticulitis. 2.  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 3.  Fullness of both ureters without overt hydroureter.  No definite prostate gland enlargement to favor bladder outlet obstruction, although the urinary bladder is borderline distended. Correlate with urinary output.  No hydronephrosis or calculus observed.   Original Report Authenticated By: Dellia Cloud, M.D.    Dg Abd Acute W/chest  05/29/2012  *RADIOLOGY REPORT*  Clinical Data: Chest pain.  Abdominal pain.  ACUTE ABDOMEN SERIES (ABDOMEN 2 VIEW & CHEST 1 VIEW)  Comparison: CT of the chest and abdomen 05/16/2011.  Two-view chest 02/22/2012.  Findings: The heart size is normal.  The lungs are clear.   Supine and upright views of the abdomen demonstrate fluid levels within nondistended loops of small bowel.  There is no obstruction or free air.  Multiple phleboliths are present within the anatomic pelvis. Mild rightward curvature of the thoracolumbar junction is noted.  There is focal leftward curvature at L4-5 with endplate changes.  IMPRESSION:  1.  Fluid levels within nondistended small bowel are most compatible with an adynamic ileus. 2.  No acute abnormality of the chest. 3.  Scoliosis.   Original Report Authenticated By: Jamesetta Orleans. MATTERN, M.D.      No diagnosis found.   Date: 05/29/2012  Rate:86  Rhythm: normal sinus rhythm  QRS Axis: normal  Intervals: normal  ST/T Wave abnormalities: normal  Conduction Disutrbances:none  Narrative Interpretation:   Old EKG Reviewed: none available    MDM     The chart was scribed for me under my direct supervision.  I personally performed the history, physical, and medical decision making and all procedures in the evaluation of this patient.Victor Lennert, MD 05/29/12 [place] [place]

## 2012-05-29 NOTE — ED Notes (Signed)
Patient reports feeling somewhat better.

## 2012-05-29 NOTE — ED Notes (Signed)
Pt reports having chest pressure since approx  8am.  Says smoked crack last night.  Reports is SOB.

## 2012-06-01 ENCOUNTER — Ambulatory Visit (AMBULATORY_SURGERY_CENTER): Payer: Medicaid Other | Admitting: Gastroenterology

## 2012-06-01 ENCOUNTER — Encounter: Payer: Self-pay | Admitting: Gastroenterology

## 2012-06-01 VITALS — BP 127/62 | HR 53 | Temp 97.8°F | Resp 21 | Ht 70.0 in | Wt 148.0 lb

## 2012-06-01 DIAGNOSIS — K2990 Gastroduodenitis, unspecified, without bleeding: Secondary | ICD-10-CM

## 2012-06-01 DIAGNOSIS — R079 Chest pain, unspecified: Secondary | ICD-10-CM

## 2012-06-01 DIAGNOSIS — K5730 Diverticulosis of large intestine without perforation or abscess without bleeding: Secondary | ICD-10-CM

## 2012-06-01 DIAGNOSIS — K625 Hemorrhage of anus and rectum: Secondary | ICD-10-CM

## 2012-06-01 DIAGNOSIS — K2970 Gastritis, unspecified, without bleeding: Secondary | ICD-10-CM

## 2012-06-01 DIAGNOSIS — D126 Benign neoplasm of colon, unspecified: Secondary | ICD-10-CM

## 2012-06-01 MED ORDER — SODIUM CHLORIDE 0.9 % IV SOLN: 500.0000 mL | INTRAVENOUS | Status: DC

## 2012-06-01 NOTE — Op Note (Signed)
[facility] [address] [place], [address]   COLONOSCOPY PROCEDURE REPORT  PATIENT: Victor Mathis, Victor Mathis  MR#: [id number] BIRTHDATE: Sep 17, 1955 , 55  yrs. old GENDER: Male ENDOSCOPIST: Rachael Fee, MD REFERRED BY: PROCEDURE DATE:  06/01/2012 PROCEDURE:   Colonoscopy with snare polypectomy ASA CLASS:   Class III INDICATIONS:minor rectal bleeding. MEDICATIONS: Propofol (Diprivan) 160 mg IV  DESCRIPTION OF PROCEDURE:   After the risks benefits and alternatives of the procedure were thoroughly explained, informed consent was obtained.  A digital rectal exam revealed no abnormalities of the rectum.   The LB [id number]  endoscope was introduced through the anus and advanced to the cecum, which was identified by both the appendix and ileocecal valve. No adverse events experienced.   The quality of the prep was good, using MoviPrep  The instrument was then slowly withdrawn as the colon was fully examined.  COLON FINDINGS: Mild diverticulosis was noted The finding was in the left colon.   Two small polyps were removed and sent to pathology (cold snare, sessile, located in sigmoid and rectum 2-49mm across). The examination was otherwise normal.  Retroflexed views revealed no abnormalities. The time to cecum=1 minutes 26 seconds. Withdrawal time=8 minutes 10 seconds.  The scope was withdrawn and the procedure completed. COMPLICATIONS: There were no complications.  ENDOSCOPIC IMPRESSION: 1.   Mild diverticulosis was noted in the left colon 2.   Two small polyps were removed and sent to pathology 3.   The examination was otherwise normal.  RECOMMENDATIONS: If the polyp(s) removed today are proven to be adenomatous (pre-cancerous) polyps, you will need a repeat colonoscopy in 5 years.  Otherwise you should continue to follow colorectal cancer screening guidelines for "routine risk" patients with colonoscopy in 10 years.  You will receive a letter within 1-2 weeks  with the results of your biopsy as well as final recommendations.  Please call my office if you have not received a letter after 3 weeks.   eSigned:  Rachael Fee, MD 06/01/2012 10:57 AM   cc:

## 2012-06-01 NOTE — Progress Notes (Signed)
Patient did not experience any of the following events: a burn prior to discharge; a fall within the facility; wrong site/side/patient/procedure/implant event; or a hospital transfer or hospital admission upon discharge from the facility. (G8907) Patient did not have preoperative order for IV antibiotic SSI prophylaxis. (G8918)  

## 2012-06-01 NOTE — Patient Instructions (Addendum)
Discharge instructions given with verbal understanding. Handouts on polyps,diverticulosis and gastritis given. Resume previous medications. YOU HAD AN ENDOSCOPIC PROCEDURE TODAY AT THE [facility]: Refer to the procedure report that was given to you for any specific questions about what was found during the examination.  If the procedure report does not answer your questions, please call your gastroenterologist to clarify.  If you requested that your care partner not be given the details of your procedure findings, then the procedure report has been included in a sealed envelope for you to review at your convenience later.  YOU SHOULD EXPECT: Some feelings of bloating in the abdomen. Passage of more gas than usual.  Walking can help get rid of the air that was put into your GI tract during the procedure and reduce the bloating. If you had a lower endoscopy (such as a colonoscopy or flexible sigmoidoscopy) you may notice spotting of blood in your stool or on the toilet paper. If you underwent a bowel prep for your procedure, then you may not have a normal bowel movement for a few days.  DIET: Your first meal following the procedure should be a light meal and then it is ok to progress to your normal diet.  A half-sandwich or bowl of soup is an example of a good first meal.  Heavy or fried foods are harder to digest and may make you feel nauseous or bloated.  Likewise meals heavy in dairy and vegetables can cause extra gas to form and this can also increase the bloating.  Drink plenty of fluids but you should avoid alcoholic beverages for 24 hours.  ACTIVITY: Your care partner should take you home directly after the procedure.  You should plan to take it easy, moving slowly for the rest of the day.  You can resume normal activity the day after the procedure however you should NOT DRIVE or use heavy machinery for 24 hours (because of the sedation medicines used during the test).    SYMPTOMS TO  REPORT IMMEDIATELY: A gastroenterologist can be reached at any hour.  During normal business hours, 8:30 AM to 5:00 PM Monday through Friday, call [phone].  After hours and on weekends, please call the GI answering service at [phone] who will take a message and have the physician on call contact you.   Following lower endoscopy (colonoscopy or flexible sigmoidoscopy):  Excessive amounts of blood in the stool  Significant tenderness or worsening of abdominal pains  Swelling of the abdomen that is new, acute  Fever of 100F or higher  Following upper endoscopy (EGD)  Vomiting of blood or coffee ground material  New chest pain or pain under the shoulder blades  Painful or persistently difficult swallowing  New shortness of breath  Fever of 100F or higher  Black, tarry-looking stools  FOLLOW UP: If any biopsies were taken you will be contacted by phone or by letter within the next 1-3 weeks.  Call your gastroenterologist if you have not heard about the biopsies in 3 weeks.  Our staff will call the home number listed on your records the next business day following your procedure to check on you and address any questions or concerns that you may have at that time regarding the information given to you following your procedure. This is a courtesy call and so if there is no answer at the home number and we have not heard from you through the emergency physician on call, we will assume that you   have returned to your regular daily activities without incident.  SIGNATURES/CONFIDENTIALITY: You and/or your care partner have signed paperwork which will be entered into your electronic medical record.  These signatures attest to the fact that that the information above on your After Visit Summary has been reviewed and is understood.  Full responsibility of the confidentiality of this discharge information lies with you and/or your care-partner.  

## 2012-06-01 NOTE — Op Note (Signed)
[facility] [address] [place], [address]   ENDOSCOPY PROCEDURE REPORT  PATIENT: Victor Mathis, Victor Mathis  MR#: [id number] BIRTHDATE: 11/28/1955 , 55  yrs. old GENDER: Male ENDOSCOPIST: Rachael Fee, MD REFERRED BY: PROCEDURE DATE:  06/01/2012 PROCEDURE:  EGD w/ biopsy ASA CLASS:     Class III INDICATIONS:  chest pains, epigastric pains: recent CT scans normal; cbc, lfts normal.. MEDICATIONS: MAC sedation, administered by CRNA and Propofol (Diprivan) 120 mg IV TOPICAL ANESTHETIC: Cetacaine Spray  DESCRIPTION OF PROCEDURE: After the risks benefits and alternatives of the procedure were thoroughly explained, informed consent was obtained.  The LB GIF-H180 [id number] endoscope was introduced through the mouth and advanced to the second portion of the duodenum. Without limitations.  The instrument was slowly withdrawn as the mucosa was fully examined.    There was mild, non-specific gastritis and duodenitis.  Biopsies taken from stomach and sent to pathology.  The examination was otherwise normal.  Retroflexed views revealed no abnormalities. The scope was then withdrawn from the patient and the procedure completed. COMPLICATIONS: There were no complications.  ENDOSCOPIC IMPRESSION: There was mild gastritis and duodenitis; biopsied The examination was otherwise normal.  RECOMMENDATIONS: You should continue to avoid NSAIDs. You should start once daily OTC prilosec (omeprazole) as discussed in at recent office visit. Await pathology from gastric biopsies.   eSigned:  Rachael Fee, MD 06/01/2012 11:06 AM

## 2012-06-04 ENCOUNTER — Telehealth: Payer: Self-pay | Admitting: *Deleted

## 2012-06-04 ENCOUNTER — Other Ambulatory Visit: Payer: Self-pay | Admitting: Cardiology

## 2012-06-04 NOTE — Telephone Encounter (Signed)
  Follow up Call-  Call back number 06/01/2012  Post procedure Call Back phone  # [phone]  Permission to leave phone message Yes     No answer and answering machine did not pick up to leave a message.

## 2012-06-05 ENCOUNTER — Telehealth: Payer: Self-pay | Admitting: Gastroenterology

## 2012-06-05 NOTE — Telephone Encounter (Signed)
Left message on machine to call back  

## 2012-06-06 NOTE — Telephone Encounter (Signed)
No answer and no voice mail the number was asking for a remote access number.

## 2012-06-06 NOTE — Telephone Encounter (Signed)
I will await further communication from the pt

## 2012-06-08 ENCOUNTER — Encounter: Payer: Self-pay | Admitting: Gastroenterology

## 2013-11-14 ENCOUNTER — Emergency Department (HOSPITAL_COMMUNITY): Payer: Medicaid Other

## 2013-11-14 ENCOUNTER — Emergency Department (HOSPITAL_COMMUNITY)
Admission: EM | Admit: 2013-11-14 | Discharge: 2013-11-14 | Disposition: A | Discharge status: Home / Self Care | Payer: Medicaid Other | Attending: Emergency Medicine | Admitting: Emergency Medicine

## 2013-11-14 ENCOUNTER — Encounter (HOSPITAL_COMMUNITY): Payer: Self-pay | Admitting: Emergency Medicine

## 2013-11-14 DIAGNOSIS — E785 Hyperlipidemia, unspecified: Secondary | ICD-10-CM | POA: Insufficient documentation

## 2013-11-14 DIAGNOSIS — Z9889 Other specified postprocedural states: Secondary | ICD-10-CM | POA: Insufficient documentation

## 2013-11-14 DIAGNOSIS — L0201 Cutaneous abscess of face: Secondary | ICD-10-CM | POA: Insufficient documentation

## 2013-11-14 DIAGNOSIS — L03211 Cellulitis of face: Principal | ICD-10-CM | POA: Insufficient documentation

## 2013-11-14 DIAGNOSIS — Z7982 Long term (current) use of aspirin: Secondary | ICD-10-CM | POA: Insufficient documentation

## 2013-11-14 DIAGNOSIS — J340 Abscess, furuncle and carbuncle of nose: Secondary | ICD-10-CM

## 2013-11-14 DIAGNOSIS — Z79899 Other long term (current) drug therapy: Secondary | ICD-10-CM | POA: Insufficient documentation

## 2013-11-14 LAB — BASIC METABOLIC PANEL
BUN: 9 mg/dL (ref 6–23)
CHLORIDE: 103 meq/L (ref 96–112)
CO2: 24 meq/L (ref 19–32)
CREATININE: 1.1 mg/dL (ref 0.50–1.35)
Calcium: 8.8 mg/dL (ref 8.4–10.5)
GFR calc Af Amer: 84 mL/min — ABNORMAL LOW (ref >90)
GFR calc non Af Amer: 72 mL/min — ABNORMAL LOW (ref >90)
GLUCOSE: 107 mg/dL — AB (ref 70–99)
POTASSIUM: 4 meq/L (ref 3.7–5.3)
Sodium: 139 mEq/L (ref 137–147)

## 2013-11-14 LAB — SEDIMENTATION RATE: Sed Rate: 5 mm/hr (ref 0–16)

## 2013-11-14 LAB — CBC WITH DIFFERENTIAL/PLATELET
BASOS PCT: 0 % (ref 0–1)
Basophils Absolute: 0 10*3/uL (ref 0.0–0.1)
Eosinophils Absolute: 0.6 10*3/uL (ref 0.0–0.7)
Eosinophils Relative: 5 % (ref 0–5)
HEMATOCRIT: 41.1 % (ref 39.0–52.0)
HEMOGLOBIN: 13.7 g/dL (ref 13.0–17.0)
LYMPHS ABS: 2.5 10*3/uL (ref 0.7–4.0)
Lymphocytes Relative: 19 % (ref 12–46)
MCH: 29.7 pg (ref 26.0–34.0)
MCHC: 33.3 g/dL (ref 30.0–36.0)
MCV: 89.2 fL (ref 78.0–100.0)
MONO ABS: 1.1 10*3/uL — AB (ref 0.1–1.0)
MONOS PCT: 9 % (ref 3–12)
NEUTROS ABS: 8.8 10*3/uL — AB (ref 1.7–7.7)
NEUTROS PCT: 67 % (ref 43–77)
Platelets: 246 10*3/uL (ref 150–400)
RBC: 4.61 MIL/uL (ref 4.22–5.81)
RDW: 12.8 % (ref 11.5–15.5)
WBC: 13 10*3/uL — ABNORMAL HIGH (ref 4.0–10.5)

## 2013-11-14 MED ORDER — CEPHALEXIN 500 MG PO CAPS: 500.0000 mg | ORAL_CAPSULE | Freq: Four times a day (QID) | ORAL | Status: DC

## 2013-11-14 MED ORDER — MORPHINE SULFATE 4 MG/ML IJ SOLN
INTRAMUSCULAR | Status: AC
  Administered 2013-11-14: 4 mg via INTRAVENOUS
  Filled 2013-11-14: qty 1

## 2013-11-14 MED ORDER — CEFAZOLIN SODIUM 1-5 GM-% IV SOLN
1.0000 g | Freq: Once | INTRAVENOUS | Status: AC
  Administered 2013-11-14: 1 g via INTRAVENOUS
  Filled 2013-11-14: qty 50

## 2013-11-14 MED ORDER — MORPHINE SULFATE 4 MG/ML IJ SOLN
4.0000 mg | Freq: Once | INTRAMUSCULAR | Status: AC
  Administered 2013-11-14: 4 mg via INTRAVENOUS
  Filled 2013-11-14: qty 1

## 2013-11-14 MED ORDER — IOHEXOL 300 MG/ML  SOLN
75.0000 mL | Freq: Once | INTRAMUSCULAR | Status: AC | PRN
  Administered 2013-11-14: 75 mL via INTRAVENOUS

## 2013-11-14 MED ORDER — OXYCODONE-ACETAMINOPHEN 5-325 MG PO TABS: 1.0000 | ORAL_TABLET | ORAL | Status: DC | PRN

## 2013-11-14 NOTE — ED Provider Notes (Signed)
CSN: [id number]     Arrival date & time 11/14/13  0059 History   None    Chief Complaint  Patient presents with  . Facial Swelling     (Consider location/radiation/quality/duration/timing/severity/associated sxs/prior Treatment) The history is provided by the patient.   58 year old male is complaining of painful swelling of the left side of his nose since yesterday morning. Pain is rated at 9/10. He denies fever, chills, sweats. Is worse with palpation but nothing makes it better. It was looked at by an RN who suggested he come to the ED for evaluation.  Past Medical History  Diagnosis Date  . History of cardiac catheterization     No significant CAD 6/13  . Hyperlipidemia    Past Surgical History  Procedure Laterality Date  . Left eye surgery  1986    [place]   Family History  Problem Relation Age of Onset  . Diabetes type II Father   . Dementia Mother    History  Substance Use Topics  . Smoking status: Never Smoker   . Smokeless tobacco: Never Used  . Alcohol Use: No     Comment: Occasional beer    Review of Systems  All other systems reviewed and are negative.     Allergies  Review of patient's allergies indicates no known allergies.  Home Medications   Current Outpatient Rx  Name  Route  Sig  Dispense  Refill  . NITROSTAT 0.4 MG SL tablet      DISSOLVE 1 TABLET UNDER THE TONGUE EVERY 5 MINUTES AS NEEDED FOR CHEST PAIN   25 tablet   6     Send future refill request to PCP   . aspirin EC 81 MG tablet   Oral   Take 81 mg by mouth daily.          Marland Kitchen EXPIRED: metoprolol succinate (TOPROL-XL) 25 MG 24 hr tablet   Oral   Take 25 mg by mouth daily.         Marland Kitchen EXPIRED: nitroGLYCERIN (NITROSTAT) 0.4 MG SL tablet   Sublingual   Place 0.4 mg under the tongue every 5 (five) minutes as needed. For chest pain         . pantoprazole (PROTONIX) 20 MG tablet      Take one pill a day   30 tablet   1   . simethicone (MYLICON) 80 MG chewable tablet  Oral   Chew 80 mg by mouth every 6 (six) hours as needed. For gas pain         . EXPIRED: simvastatin (ZOCOR) 20 MG tablet   Oral   Take 20 mg by mouth at bedtime.          BP 119/83  Pulse 79  Temp(Src) 98.4 F (36.9 C) (Oral)  Resp 20  Ht 5\' 7"  (1.702 m)  Wt 148 lb (67.132 kg)  BMI 23.17 kg/m2  SpO2 100% Physical Exam  Nursing note and vitals reviewed.  58 year old male, resting comfortably and in no acute distress. Vital signs are normal. Oxygen saturation is 100%, which is normal. Head is normocephalic and atraumatic. PERRLA, EOMI. Oropharynx is clear. There is erythema and soft tissue swelling of the left side of the bridge of the nose and involving the left ala. Examination of the left nostril is normal. Neck is nontender and supple without adenopathy or JVD. Back is nontender and there is no CVA tenderness. Lungs are clear without rales, wheezes, or rhonchi. Chest is nontender.  Heart has regular rate and rhythm without murmur. Abdomen is soft, flat, nontender without masses or hepatosplenomegaly and peristalsis is normoactive. Extremities have no cyanosis or edema, full range of motion is present. Skin is warm and dry without rash. Neurologic: Mental status is normal, cranial nerves are intact, there are no motor or sensory deficits.  ED Course  Procedures (including critical care time) Labs Review Results for orders placed during the hospital encounter of 11/14/13  CBC WITH DIFFERENTIAL      Result Value Ref Range   WBC 13.0 (*) 4.0 - 10.5 K/uL   RBC 4.61  4.22 - 5.81 MIL/uL   Hemoglobin 13.7  13.0 - 17.0 g/dL   HCT 41.1  39.0 - 52.0 %   MCV 89.2  78.0 - 100.0 fL   MCH 29.7  26.0 - 34.0 pg   MCHC 33.3  30.0 - 36.0 g/dL   RDW 12.8  11.5 - 15.5 %   Platelets 246  150 - 400 K/uL   Neutrophils Relative % 67  43 - 77 %   Neutro Abs 8.8 (*) 1.7 - 7.7 K/uL   Lymphocytes Relative 19  12 - 46 %   Lymphs Abs 2.5  0.7 - 4.0 K/uL   Monocytes Relative 9  3 - 12 %    Monocytes Absolute 1.1 (*) 0.1 - 1.0 K/uL   Eosinophils Relative 5  0 - 5 %   Eosinophils Absolute 0.6  0.0 - 0.7 K/uL   Basophils Relative 0  0 - 1 %   Basophils Absolute 0.0  0.0 - 0.1 K/uL  BASIC METABOLIC PANEL      Result Value Ref Range   Sodium 139  137 - 147 mEq/L   Potassium 4.0  3.7 - 5.3 mEq/L   Chloride 103  96 - 112 mEq/L   CO2 24  19 - 32 mEq/L   Glucose, Bld 107 (*) 70 - 99 mg/dL   BUN 9  6 - 23 mg/dL   Creatinine, Ser 1.10  0.50 - 1.35 mg/dL   Calcium 8.8  8.4 - 10.5 mg/dL   GFR calc non Af Amer 72 (*) >90 mL/min   GFR calc Af Amer 84 (*) >90 mL/min  SEDIMENTATION RATE      Result Value Ref Range   Sed Rate 5  0 - 16 mm/hr   Imaging Review Ct Maxillofacial W/cm  11/14/2013   CLINICAL DATA:  Left nose swelling.  EXAM: CT MAXILLOFACIAL WITH CONTRAST  TECHNIQUE: Multidetector CT imaging of the maxillofacial structures was performed with intravenous contrast. Multiplanar CT image reconstructions were also generated. A small metallic BB was placed on the right temple in order to reliably differentiate right from left.  CONTRAST:  54mL OMNIPAQUE IOHEXOL 300 MG/ML  SOLN  COMPARISON:  None.  FINDINGS: Soft tissue swelling of the left nasal arch with an elongated, up to 6 mm pustule within the left nasal ala, closest to the mucosal surface. No intraorbital or facial vein occlusion. No evidence of global or retro-orbital infection.  There is patchy, inflammatory mucosal thickening in the bilateral paranasal sinuses, with a notable polyp or retention cyst in the anterior right maxillary sinus. There is a fluid level within the right maxillary antrum, consistent with acute sinusitis. No invasive features around the maxillary sinuses.  IMPRESSION: 1. Left nasal cellulitis with 6 mm pustule in the left nasal ala - closest to the mucosal surface. 2. Multi focal sinusitis with right maxillary effusion.   Electronically Signed  By: Jorje Guild M.D.   On: 11/14/2013 05:47   Images viewed  by me.  MDM   Final diagnoses:  Cellulitis of nose    Facial cellulitis involving the bridge of the nose. He'll be sent for CT scan to make sure there is no cavernous sinus thrombosis. He is started on cefazolin and is given morphine for pain. Old records are reviewed and he has no relevant past visits.  CT confirms cellulitis of the nose without evidence of cavernous sinus thrombosis. He is discharged with prescriptions for cephalexin and oxycodone-acetaminophen.  Delora Fuel, MD 83/15/17 [id number]

## 2013-11-14 NOTE — ED Notes (Signed)
Patient presents to ER with c/o left sided nasal swelling x 1 day.

## 2013-11-14 NOTE — Discharge Instructions (Signed)
Return to the ED if the redness and swelling get worse.   Cellulitis Cellulitis is an infection of the skin and the tissue beneath it. The infected area is usually red and tender. Cellulitis occurs most often in the arms and lower legs.  CAUSES  Cellulitis is caused by bacteria that enter the skin through cracks or cuts in the skin. The most common types of bacteria that cause cellulitis are Staphylococcus and Streptococcus. SYMPTOMS   Redness and warmth.  Swelling.  Tenderness or pain.  Fever. DIAGNOSIS  Your caregiver can usually determine what is wrong based on a physical exam. Blood tests may also be done. TREATMENT  Treatment usually involves taking an antibiotic medicine. HOME CARE INSTRUCTIONS   Take your antibiotics as directed. Finish them even if you start to feel better.  Keep the infected arm or leg elevated to reduce swelling.  Apply a warm cloth to the affected area up to 4 times per day to relieve pain.  Only take over-the-counter or prescription medicines for pain, discomfort, or fever as directed by your caregiver.  Keep all follow-up appointments as directed by your caregiver. SEEK MEDICAL CARE IF:   You notice red streaks coming from the infected area.  Your red area gets larger or turns dark in color.  Your bone or joint underneath the infected area becomes painful after the skin has healed.  Your infection returns in the same area or another area.  You notice a swollen bump in the infected area.  You develop new symptoms. SEEK IMMEDIATE MEDICAL CARE IF:   You have a fever.  You feel very sleepy.  You develop vomiting or diarrhea.  You have a general ill feeling (malaise) with muscle aches and pains. MAKE SURE YOU:   Understand these instructions.  Will watch your condition.  Will get help right away if you are not doing well or get worse. Document Released: 05/04/2005 Document Revised: 01/24/2012 Document Reviewed: 10/10/2011 [facility]  Patient Information 2014 [place].  Cephalexin tablets or capsules What is this medicine? CEPHALEXIN (sef a LEX in) is a cephalosporin antibiotic. It is used to treat certain kinds of bacterial infections It will not work for colds, flu, or other viral infections. This medicine may be used for other purposes; ask your health care provider or pharmacist if you have questions. COMMON BRAND NAME(S): Biocef, Keflex, Keftab What should I tell my health care provider before I take this medicine? They need to know if you have any of these conditions: -kidney disease -stomach or intestine problems, especially colitis -an unusual or allergic reaction to cephalexin, other cephalosporins, penicillins, other antibiotics, medicines, foods, dyes or preservatives -pregnant or trying to get pregnant -breast-feeding How should I use this medicine? Take this medicine by mouth with a full glass of water. Follow the directions on the prescription label. This medicine can be taken with or without food. Take your medicine at regular intervals. Do not take your medicine more often than directed. Take all of your medicine as directed even if you think you are better. Do not skip doses or stop your medicine early. Talk to your pediatrician regarding the use of this medicine in children. While this drug may be prescribed for selected conditions, precautions do apply. Overdosage: If you think you have taken too much of this medicine contact a poison control center or emergency room at once. NOTE: This medicine is only for you. Do not share this medicine with others. What if I miss a dose? If  you miss a dose, take it as soon as you can. If it is almost time for your next dose, take only that dose. Do not take double or extra doses. There should be at least 4 to 6 hours between doses. What may interact with this medicine? -probenecid -some other antibiotics This list may not describe all possible interactions. Give  your health care provider a list of all the medicines, herbs, non-prescription drugs, or dietary supplements you use. Also tell them if you smoke, drink alcohol, or use illegal drugs. Some items may interact with your medicine. What should I watch for while using this medicine? Tell your doctor or health care professional if your symptoms do not begin to improve in a few days. Do not treat diarrhea with over the counter products. Contact your doctor if you have diarrhea that lasts more than 2 days or if it is severe and watery. If you have diabetes, you may get a false-positive result for sugar in your urine. Check with your doctor or health care professional. What side effects may I notice from receiving this medicine? Side effects that you should report to your doctor or health care professional as soon as possible: -allergic reactions like skin rash, itching or hives, swelling of the face, lips, or tongue -breathing problems -pain or trouble passing urine -redness, blistering, peeling or loosening of the skin, including inside the mouth -severe or watery diarrhea -unusually weak or tired -yellowing of the eyes, skin Side effects that usually do not require medical attention (report to your doctor or health care professional if they continue or are bothersome): -gas or heartburn -genital or anal irritation -headache -joint or muscle pain -nausea, vomiting This list may not describe all possible side effects. Call your doctor for medical advice about side effects. You may report side effects to FDA at 1-800-FDA-1088. Where should I keep my medicine? Keep out of the reach of children. Store at room temperature between 59 and 86 degrees F (15 and 30 degrees C). Throw away any unused medicine after the expiration date. NOTE: This sheet is a summary. It may not cover all possible information. If you have questions about this medicine, talk to your doctor, pharmacist, or health care provider.   2014, Elsevier/Gold Standard. (2007-10-29 17:09:13)  Acetaminophen; Oxycodone tablets What is this medicine? ACETAMINOPHEN; OXYCODONE (a set a MEE noe fen; ox i KOE done) is a pain reliever. It is used to treat mild to moderate pain. This medicine may be used for other purposes; ask your health care provider or pharmacist if you have questions. COMMON BRAND NAME(S): Endocet, Magnacet, Narvox, Percocet, Perloxx, Primalev, Primlev, Roxicet, Xolox What should I tell my health care provider before I take this medicine? They need to know if you have any of these conditions: -brain tumor -Crohn's disease, inflammatory bowel disease, or ulcerative colitis -drug abuse or addiction -head injury -heart or circulation problems -if you often drink alcohol -kidney disease or problems going to the bathroom -liver disease -lung disease, asthma, or breathing problems -an unusual or allergic reaction to acetaminophen, oxycodone, other opioid analgesics, other medicines, foods, dyes, or preservatives -pregnant or trying to get pregnant -breast-feeding How should I use this medicine? Take this medicine by mouth with a full glass of water. Follow the directions on the prescription label. Take your medicine at regular intervals. Do not take your medicine more often than directed. Talk to your pediatrician regarding the use of this medicine in children. Special care may be needed. Patients  over 78 years old may have a stronger reaction and need a smaller dose. Overdosage: If you think you have taken too much of this medicine contact a poison control center or emergency room at once. NOTE: This medicine is only for you. Do not share this medicine with others. What if I miss a dose? If you miss a dose, take it as soon as you can. If it is almost time for your next dose, take only that dose. Do not take double or extra doses. What may interact with this medicine? -alcohol -antihistamines -barbiturates like  amobarbital, butalbital, butabarbital, methohexital, pentobarbital, phenobarbital, thiopental, and secobarbital -benztropine -drugs for bladder problems like solifenacin, trospium, oxybutynin, tolterodine, hyoscyamine, and methscopolamine -drugs for breathing problems like ipratropium and tiotropium -drugs for certain stomach or intestine problems like propantheline, homatropine methylbromide, glycopyrrolate, atropine, belladonna, and dicyclomine -general anesthetics like etomidate, ketamine, nitrous oxide, propofol, desflurane, enflurane, halothane, isoflurane, and sevoflurane -medicines for depression, anxiety, or psychotic disturbances -medicines for sleep -muscle relaxants -naltrexone -narcotic medicines (opiates) for pain -phenothiazines like perphenazine, thioridazine, chlorpromazine, mesoridazine, fluphenazine, prochlorperazine, promazine, and trifluoperazine -scopolamine -tramadol -trihexyphenidyl This list may not describe all possible interactions. Give your health care provider a list of all the medicines, herbs, non-prescription drugs, or dietary supplements you use. Also tell them if you smoke, drink alcohol, or use illegal drugs. Some items may interact with your medicine. What should I watch for while using this medicine? Tell your doctor or health care professional if your pain does not go away, if it gets worse, or if you have new or a different type of pain. You may develop tolerance to the medicine. Tolerance means that you will need a higher dose of the medication for pain relief. Tolerance is normal and is expected if you take this medicine for a long time. Do not suddenly stop taking your medicine because you may develop a severe reaction. Your body becomes used to the medicine. This does NOT mean you are addicted. Addiction is a behavior related to getting and using a drug for a non-medical reason. If you have pain, you have a medical reason to take pain medicine. Your doctor  will tell you how much medicine to take. If your doctor wants you to stop the medicine, the dose will be slowly lowered over time to avoid any side effects. You may get drowsy or dizzy. Do not drive, use machinery, or do anything that needs mental alertness until you know how this medicine affects you. Do not stand or sit up quickly, especially if you are an older patient. This reduces the risk of dizzy or fainting spells. Alcohol may interfere with the effect of this medicine. Avoid alcoholic drinks. There are different types of narcotic medicines (opiates) for pain. If you take more than one type at the same time, you may have more side effects. Give your health care provider a list of all medicines you use. Your doctor will tell you how much medicine to take. Do not take more medicine than directed. Call emergency for help if you have problems breathing. The medicine will cause constipation. Try to have a bowel movement at least every 2 to 3 days. If you do not have a bowel movement for 3 days, call your doctor or health care professional. Do not take Tylenol (acetaminophen) or medicines that have acetaminophen with this medicine. Too much acetaminophen can be very dangerous. Many nonprescription medicines contain acetaminophen. Always read the labels carefully to avoid taking more acetaminophen. What side  effects may I notice from receiving this medicine? Side effects that you should report to your doctor or health care professional as soon as possible: -allergic reactions like skin rash, itching or hives, swelling of the face, lips, or tongue -breathing difficulties, wheezing -confusion -light headedness or fainting spells -severe stomach pain -unusually weak or tired -yellowing of the skin or the whites of the eyes  Side effects that usually do not require medical attention (report to your doctor or health care professional if they continue or are  bothersome): -dizziness -drowsiness -nausea -vomiting This list may not describe all possible side effects. Call your doctor for medical advice about side effects. You may report side effects to FDA at 1-800-FDA-1088. Where should I keep my medicine? Keep out of the reach of children. This medicine can be abused. Keep your medicine in a safe place to protect it from theft. Do not share this medicine with anyone. Selling or giving away this medicine is dangerous and against the law. Store at room temperature between 20 and 25 degrees C (68 and 77 degrees F). Keep container tightly closed. Protect from light. This medicine may cause accidental overdose and death if it is taken by other adults, children, or pets. Flush any unused medicine down the toilet to reduce the chance of harm. Do not use the medicine after the expiration date. NOTE: This sheet is a summary. It may not cover all possible information. If you have questions about this medicine, talk to your doctor, pharmacist, or health care provider.  2014, Elsevier/Gold Standard. (2013-03-18 13:17:35)

## 2014-07-17 ENCOUNTER — Encounter (HOSPITAL_COMMUNITY): Payer: Self-pay | Admitting: Cardiovascular Disease

## 2015-01-28 ENCOUNTER — Emergency Department (HOSPITAL_COMMUNITY): Payer: Medicaid Other

## 2015-01-28 ENCOUNTER — Encounter (HOSPITAL_COMMUNITY): Payer: Self-pay | Admitting: Emergency Medicine

## 2015-01-28 ENCOUNTER — Emergency Department (HOSPITAL_COMMUNITY)
Admission: EM | Admit: 2015-01-28 | Discharge: 2015-01-28 | Disposition: A | Discharge status: Home / Self Care | Payer: Medicaid Other | Attending: Emergency Medicine | Admitting: Emergency Medicine

## 2015-01-28 DIAGNOSIS — W01198A Fall on same level from slipping, tripping and stumbling with subsequent striking against other object, initial encounter: Secondary | ICD-10-CM | POA: Insufficient documentation

## 2015-01-28 DIAGNOSIS — Z8639 Personal history of other endocrine, nutritional and metabolic disease: Secondary | ICD-10-CM | POA: Diagnosis not present

## 2015-01-28 DIAGNOSIS — S29001A Unspecified injury of muscle and tendon of front wall of thorax, initial encounter: Secondary | ICD-10-CM | POA: Diagnosis present

## 2015-01-28 DIAGNOSIS — Y9389 Activity, other specified: Secondary | ICD-10-CM | POA: Insufficient documentation

## 2015-01-28 DIAGNOSIS — Y929 Unspecified place or not applicable: Secondary | ICD-10-CM | POA: Insufficient documentation

## 2015-01-28 DIAGNOSIS — R0602 Shortness of breath: Secondary | ICD-10-CM | POA: Diagnosis not present

## 2015-01-28 DIAGNOSIS — Y998 Other external cause status: Secondary | ICD-10-CM | POA: Diagnosis not present

## 2015-01-28 DIAGNOSIS — Z9889 Other specified postprocedural states: Secondary | ICD-10-CM | POA: Diagnosis not present

## 2015-01-28 DIAGNOSIS — S298XXA Other specified injuries of thorax, initial encounter: Secondary | ICD-10-CM

## 2015-01-28 MED ORDER — IBUPROFEN 800 MG PO TABS
800.0000 mg | ORAL_TABLET | Freq: Once | ORAL | Status: AC
  Administered 2015-01-28: 800 mg via ORAL
  Filled 2015-01-28: qty 1

## 2015-01-28 MED ORDER — ACETAMINOPHEN 325 MG PO TABS
650.0000 mg | ORAL_TABLET | Freq: Once | ORAL | Status: AC
  Administered 2015-01-28: 650 mg via ORAL
  Filled 2015-01-28: qty 2

## 2015-01-28 MED ORDER — OXYCODONE-ACETAMINOPHEN 5-325 MG PO TABS
1.0000 | ORAL_TABLET | Freq: Once | ORAL | Status: AC
  Administered 2015-01-28: 1 via ORAL
  Filled 2015-01-28: qty 1

## 2015-01-28 NOTE — ED Notes (Signed)
Pt states he fell 4 days ago and c/o left rib pain.

## 2015-01-28 NOTE — ED Notes (Signed)
Pt back from xray, given additional pain meds. RT in room teaching pt about the incentive spirometer.

## 2015-01-28 NOTE — ED Provider Notes (Signed)
CSN: [id number]     Arrival date & time 01/28/15  2127 History  .This chart was scribed for Ripley Fraise, MD by Julien Nordmann, ED Scribe. This patient was seen in room [facility]10/[facility]10 and the patient's care was started at 10:03 PM.    Chief Complaint  Patient presents with  . Fall      Patient is a 59 y.o. male presenting with fall. The history is provided by the patient. No language interpreter was used.  Fall This is a new problem. The current episode started more than 1 week ago. The problem occurs rarely. The problem has been gradually worsening. Associated symptoms include shortness of breath. Pertinent negatives include no chest pain, no abdominal pain and no headaches. The symptoms are aggravated by sneezing and coughing. The symptoms are relieved by lying down. He has tried acetaminophen for the symptoms.    HPI Comments: Victor Mathis is a 59 y.o. male who has a hx of cardiac catherization presents to the Emergency Department complaining of a fall that occurred about one week ago. Pt notes he was trying to open a door and he slipped and fell and notes a table hit his ribs. Pt denies LOC and head injury. Pt notes intermittent sharp left sided rib pain. He reports when he coughs or sneezes the pain gets worse.  He denies fever, vomiting, abdominal pain, neck pain, back pain   Past Medical History  Diagnosis Date  . History of cardiac catheterization     No significant CAD 6/13  . Hyperlipidemia    Past Surgical History  Procedure Laterality Date  . Left eye surgery  1986    [place]  . Left heart catheterization with coronary angiogram N/A 01/19/2012    Procedure: LEFT HEART CATHETERIZATION WITH CORONARY ANGIOGRAM;  Surgeon: Burnell Blanks, MD;  Location: [facility] CATH LAB;  Service: Cardiovascular;  Laterality: N/A;   Family History  Problem Relation Age of Onset  . Diabetes type II Father   . Dementia Mother    History  Substance Use Topics  . Smoking status: Never  Smoker   . Smokeless tobacco: Never Used  . Alcohol Use: Yes     Comment: Occasional beer    Review of Systems  Constitutional: Negative for fever.  Respiratory: Positive for shortness of breath.   Cardiovascular: Negative for chest pain.  Gastrointestinal: Negative for abdominal pain.  Musculoskeletal: Negative for back pain and neck pain.  Neurological: Negative for headaches.  All other systems reviewed and are negative.     Allergies  Review of patient's allergies indicates no known allergies.  Home Medications   Prior to Admission medications   Not on File   Triage vitals: BP 137/83 mmHg  Pulse 77  Temp(Src) 97.7 F (36.5 C) (Oral)  Resp 20  Ht 5\' 7"  (1.702 m)  Wt 140 lb (63.504 kg)  BMI 21.92 kg/m2  SpO2 100% Physical Exam CONSTITUTIONAL: Well developed/well nourished HEAD: Normocephalic/atraumatic EYES: EOMI/PERRL ENMT: Mucous membranes moist NECK: supple no meningeal signs SPINE/BACK:entire spine nontender CV: S1/S2 noted, no murmurs/rubs/gallops noted Chest: diffuse tenderness to left chest wall, no crepitus or bruising LUNGS: Lungs are clear to auscultation bilaterally, no apparent distress ABDOMEN: soft, nontender, no rebound or guarding, bowel sounds noted throughout abdomen GU:no cva tenderness NEURO: Pt is awake/alert/appropriate, moves all extremitiesx4.  No facial droop.   EXTREMITIES: pulses normal/equal, full ROM SKIN: warm, color normal PSYCH: no abnormalities of mood noted, alert and oriented to situation  ED Course  Procedures  DIAGNOSTIC STUDIES: Oxygen Saturation is 100% on RA, normal by my interpretation.  COORDINATION OF CARE:  10:07 PM Discussed treatment plan which includes CXR, percocet, tylenol with pt at bedside and pt agreed to plan. 11:22 PM Imaging negative Pt well appearing He denies any other complaints other than chest wall pain after fall Stable for d/c home Labs Review Labs Reviewed - No data to display  Imaging  Review Dg Ribs Unilateral W/chest Left  01/28/2015   CLINICAL DATA:  Anterior left rib pain for 1 week  EXAM: LEFT RIBS AND CHEST - 3+ VIEW  COMPARISON:  02/22/2012  FINDINGS: Four views left ribs submitted. No acute infiltrate or pulmonary edema. No left rib fracture. No pneumothorax.  IMPRESSION: Negative.   Electronically Signed   By: Lahoma Crocker M.D.   On: 01/28/2015 22:49     EKG Interpretation None      MDM   Final diagnoses:  Blunt chest trauma, initial encounter    Nursing notes including past medical history and social history reviewed and considered in documentation xrays/imaging reviewed by myself and considered during evaluation   I personally performed the services described in this documentation, which was scribed in my presence. The recorded information has been reviewed and is accurate.       Ripley Fraise, MD 01/28/15 2322

## 2015-01-28 NOTE — Discharge Instructions (Signed)
Blunt Chest Trauma °Blunt chest trauma is an injury caused by a blow to the chest. These chest injuries can be very painful. Blunt chest trauma often results in bruised or broken (fractured) ribs. Most cases of bruised and fractured ribs from blunt chest traumas get better after 1 to 3 weeks of rest and pain medicine. Often, the soft tissue in the chest wall is also injured, causing pain and bruising. Internal organs, such as the heart and lungs, may also be injured. Blunt chest trauma can lead to serious medical problems. This injury requires immediate medical care. °CAUSES  °· Motor vehicle collisions. °· Falls. °· Physical violence. °· Sports injuries. °SYMPTOMS  °· Chest pain. The pain may be worse when you move or breathe deeply. °· Shortness of breath. °· Lightheadedness. °· Bruising. °· Tenderness. °· Swelling. °DIAGNOSIS  °Your caregiver will do a physical exam. X-rays may be taken to look for fractures. However, minor rib fractures may not show up on X-rays until a few days after the injury. If a more serious injury is suspected, further imaging tests may be done. This may include ultrasounds, computed tomography (CT) scans, or magnetic resonance imaging (MRI). °TREATMENT  °Treatment depends on the severity of your injury. Your caregiver may prescribe pain medicines and deep breathing exercises. °HOME CARE INSTRUCTIONS °· Limit your activities until you can move around without much pain. °· Do not do any strenuous work until your injury is healed. °· Put ice on the injured area. °¨ Put ice in a plastic bag. °¨ Place a towel between your skin and the bag. °¨ Leave the ice on for 15-20 minutes, 03-04 times a day. °· You may wear a rib belt as directed by your caregiver to reduce pain. °· Practice deep breathing as directed by your caregiver to keep your lungs clear. °· Only take over-the-counter or prescription medicines for pain, fever, or discomfort as directed by your caregiver. °SEEK IMMEDIATE MEDICAL  CARE IF:  °· You have increasing pain or shortness of breath. °· You cough up blood. °· You have nausea, vomiting, or abdominal pain. °· You have a fever. °· You feel dizzy, weak, or you faint. °MAKE SURE YOU: °· Understand these instructions. °· Will watch your condition. °· Will get help right away if you are not doing well or get worse. °Document Released: 09/01/2004 Document Revised: 10/17/2011 Document Reviewed: 05/11/2011 °[employer]® Patient Information ©2015 [employer]. This information is not intended to replace advice given to you by your health care provider. Make sure you discuss any questions you have with your health care provider. ° ° °

## 2016-08-24 DIAGNOSIS — R1084 Generalized abdominal pain: Secondary | ICD-10-CM | POA: Diagnosis not present

## 2016-08-24 DIAGNOSIS — K529 Noninfective gastroenteritis and colitis, unspecified: Secondary | ICD-10-CM | POA: Diagnosis not present

## 2017-06-12 ENCOUNTER — Encounter: Payer: Self-pay | Admitting: Gastroenterology

## 2017-07-18 ENCOUNTER — Emergency Department (HOSPITAL_COMMUNITY): Payer: Medicaid Other

## 2017-07-18 ENCOUNTER — Other Ambulatory Visit: Payer: Self-pay

## 2017-07-18 ENCOUNTER — Encounter (HOSPITAL_COMMUNITY): Payer: Self-pay | Admitting: *Deleted

## 2017-07-18 ENCOUNTER — Emergency Department (HOSPITAL_COMMUNITY)
Admission: EM | Admit: 2017-07-18 | Discharge: 2017-07-19 | Disposition: A | Discharge status: Home / Self Care | Payer: Medicaid Other | Attending: Emergency Medicine | Admitting: Emergency Medicine

## 2017-07-18 DIAGNOSIS — Y92410 Unspecified street and highway as the place of occurrence of the external cause: Secondary | ICD-10-CM | POA: Insufficient documentation

## 2017-07-18 DIAGNOSIS — M79605 Pain in left leg: Secondary | ICD-10-CM | POA: Diagnosis not present

## 2017-07-18 DIAGNOSIS — Y999 Unspecified external cause status: Secondary | ICD-10-CM | POA: Insufficient documentation

## 2017-07-18 DIAGNOSIS — Y939 Activity, unspecified: Secondary | ICD-10-CM | POA: Diagnosis not present

## 2017-07-18 DIAGNOSIS — R202 Paresthesia of skin: Secondary | ICD-10-CM | POA: Diagnosis not present

## 2017-07-18 DIAGNOSIS — S4992XA Unspecified injury of left shoulder and upper arm, initial encounter: Secondary | ICD-10-CM | POA: Diagnosis not present

## 2017-07-18 DIAGNOSIS — S161XXA Strain of muscle, fascia and tendon at neck level, initial encounter: Secondary | ICD-10-CM | POA: Diagnosis not present

## 2017-07-18 DIAGNOSIS — S0990XA Unspecified injury of head, initial encounter: Secondary | ICD-10-CM | POA: Diagnosis present

## 2017-07-18 DIAGNOSIS — R1084 Generalized abdominal pain: Secondary | ICD-10-CM | POA: Diagnosis not present

## 2017-07-18 DIAGNOSIS — M79602 Pain in left arm: Secondary | ICD-10-CM

## 2017-07-18 LAB — COMPREHENSIVE METABOLIC PANEL
ALBUMIN: 3.7 g/dL (ref 3.5–5.0)
ALT: 28 U/L (ref 17–63)
AST: 30 U/L (ref 15–41)
Alkaline Phosphatase: 79 U/L (ref 38–126)
Anion gap: 11 (ref 5–15)
BUN: 14 mg/dL (ref 6–20)
CHLORIDE: 102 mmol/L (ref 101–111)
CO2: 20 mmol/L — AB (ref 22–32)
CREATININE: 1 mg/dL (ref 0.61–1.24)
Calcium: 8.9 mg/dL (ref 8.9–10.3)
GFR calc Af Amer: >60 mL/min (ref >60)
GFR calc non Af Amer: >60 mL/min (ref >60)
Glucose, Bld: 108 mg/dL — ABNORMAL HIGH (ref 65–99)
POTASSIUM: 3.4 mmol/L — AB (ref 3.5–5.1)
SODIUM: 133 mmol/L — AB (ref 135–145)
Total Bilirubin: 0.4 mg/dL (ref 0.3–1.2)
Total Protein: 7 g/dL (ref 6.5–8.1)

## 2017-07-18 LAB — CBC
HEMATOCRIT: 43.1 % (ref 39.0–52.0)
Hemoglobin: 14.4 g/dL (ref 13.0–17.0)
MCH: 30.4 pg (ref 26.0–34.0)
MCHC: 33.4 g/dL (ref 30.0–36.0)
MCV: 90.9 fL (ref 78.0–100.0)
PLATELETS: 269 10*3/uL (ref 150–400)
RBC: 4.74 MIL/uL (ref 4.22–5.81)
RDW: 12.7 % (ref 11.5–15.5)
WBC: 9.7 10*3/uL (ref 4.0–10.5)

## 2017-07-18 LAB — I-STAT CHEM 8, ED
BUN: 14 mg/dL (ref 6–20)
CREATININE: 1 mg/dL (ref 0.61–1.24)
Calcium, Ion: 1.08 mmol/L — ABNORMAL LOW (ref 1.15–1.40)
Chloride: 103 mmol/L (ref 101–111)
Glucose, Bld: 107 mg/dL — ABNORMAL HIGH (ref 65–99)
HEMATOCRIT: 43 % (ref 39.0–52.0)
HEMOGLOBIN: 14.6 g/dL (ref 13.0–17.0)
POTASSIUM: 4.1 mmol/L (ref 3.5–5.1)
SODIUM: 138 mmol/L (ref 135–145)
TCO2: 22 mmol/L (ref 22–32)

## 2017-07-18 LAB — ETHANOL: Alcohol, Ethyl (B): <10 mg/dL (ref <10)

## 2017-07-18 LAB — PROTIME-INR
INR: 0.89
PROTHROMBIN TIME: 12 s (ref 11.4–15.2)

## 2017-07-18 LAB — I-STAT CG4 LACTIC ACID, ED: Lactic Acid, Venous: 2.5 mmol/L (ref 0.5–1.9)

## 2017-07-18 MED ORDER — ONDANSETRON HCL 4 MG/2ML IJ SOLN
4.0000 mg | Freq: Once | INTRAMUSCULAR | Status: AC
  Administered 2017-07-18: 4 mg via INTRAVENOUS
  Filled 2017-07-18: qty 2

## 2017-07-18 MED ORDER — MORPHINE SULFATE (PF) 4 MG/ML IV SOLN
4.0000 mg | Freq: Once | INTRAVENOUS | Status: AC
Start: 2017-07-18 — End: 2017-07-18
  Administered 2017-07-18: 4 mg via INTRAVENOUS
  Filled 2017-07-18: qty 1

## 2017-07-18 MED ORDER — SODIUM CHLORIDE 0.9 % IV BOLUS (SEPSIS)
500.0000 mL | Freq: Once | INTRAVENOUS | Status: AC
  Administered 2017-07-18: 500 mL via INTRAVENOUS

## 2017-07-18 MED ORDER — OXYCODONE-ACETAMINOPHEN 5-325 MG PO TABS
1.0000 | ORAL_TABLET | Freq: Once | ORAL | Status: AC
  Administered 2017-07-19: 1 via ORAL
  Filled 2017-07-18: qty 1

## 2017-07-18 MED ORDER — IOPAMIDOL (ISOVUE-300) INJECTION 61%
100.0000 mL | Freq: Once | INTRAVENOUS | Status: AC | PRN
  Administered 2017-07-18: 100 mL via INTRAVENOUS
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
## 2017-07-19 LAB — I-STAT CG4 LACTIC ACID, ED: LACTIC ACID, VENOUS: 1.95 mmol/L — AB (ref 0.5–1.9)

## 2017-07-19 MED ORDER — OXYCODONE-ACETAMINOPHEN 5-325 MG PO TABS: 2.0000 | ORAL_TABLET | ORAL | 0 refills | Status: DC | PRN

## 2017-07-19 MED ORDER — METHOCARBAMOL 500 MG PO TABS: 500.0000 mg | ORAL_TABLET | Freq: Two times a day (BID) | ORAL | 0 refills | Status: DC

## 2017-07-19 MED ORDER — IBUPROFEN 800 MG PO TABS: 800.0000 mg | ORAL_TABLET | Freq: Three times a day (TID) | ORAL | 0 refills | Status: DC | PRN

## 2017-07-19 NOTE — Discharge Instructions (Signed)

## 2017-07-24 ENCOUNTER — Emergency Department (HOSPITAL_COMMUNITY)
Admission: EM | Admit: 2017-07-24 | Discharge: 2017-07-24 | Disposition: A | Discharge status: Home / Self Care | Payer: Medicaid Other | Attending: Emergency Medicine | Admitting: Emergency Medicine

## 2017-07-24 ENCOUNTER — Emergency Department (HOSPITAL_COMMUNITY): Payer: Medicaid Other

## 2017-07-24 ENCOUNTER — Other Ambulatory Visit: Payer: Self-pay

## 2017-07-24 ENCOUNTER — Encounter (HOSPITAL_COMMUNITY): Payer: Self-pay | Admitting: *Deleted

## 2017-07-24 DIAGNOSIS — M25511 Pain in right shoulder: Secondary | ICD-10-CM | POA: Insufficient documentation

## 2017-07-24 DIAGNOSIS — M25512 Pain in left shoulder: Secondary | ICD-10-CM | POA: Insufficient documentation

## 2017-07-24 DIAGNOSIS — R072 Precordial pain: Secondary | ICD-10-CM | POA: Insufficient documentation

## 2017-07-24 LAB — BASIC METABOLIC PANEL
ANION GAP: 12 (ref 5–15)
BUN: 13 mg/dL (ref 6–20)
CALCIUM: 9.1 mg/dL (ref 8.9–10.3)
CO2: 24 mmol/L (ref 22–32)
Chloride: 102 mmol/L (ref 101–111)
Creatinine, Ser: 0.9 mg/dL (ref 0.61–1.24)
Glucose, Bld: 97 mg/dL (ref 65–99)
POTASSIUM: 3.6 mmol/L (ref 3.5–5.1)
SODIUM: 138 mmol/L (ref 135–145)

## 2017-07-24 LAB — TROPONIN I

## 2017-07-24 LAB — CBC
HEMATOCRIT: 44.1 % (ref 39.0–52.0)
HEMOGLOBIN: 14.6 g/dL (ref 13.0–17.0)
MCH: 30.5 pg (ref 26.0–34.0)
MCHC: 33.1 g/dL (ref 30.0–36.0)
MCV: 92.3 fL (ref 78.0–100.0)
Platelets: 244 10*3/uL (ref 150–400)
RBC: 4.78 MIL/uL (ref 4.22–5.81)
RDW: 12.8 % (ref 11.5–15.5)
WBC: 9.4 10*3/uL (ref 4.0–10.5)

## 2017-07-24 MED ORDER — METHOCARBAMOL 500 MG PO TABS: 500.0000 mg | ORAL_TABLET | Freq: Two times a day (BID) | ORAL | 0 refills | Status: DC

## 2017-07-24 MED ORDER — DOXYCYCLINE HYCLATE 100 MG PO CAPS: 100.0000 mg | ORAL_CAPSULE | Freq: Two times a day (BID) | ORAL | 0 refills | Status: AC

## 2017-07-24 MED ORDER — IBUPROFEN 800 MG PO TABS: 800.0000 mg | ORAL_TABLET | Freq: Three times a day (TID) | ORAL | 0 refills | Status: DC | PRN
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
## 2017-07-24 NOTE — ED Notes (Signed)
Walked in room to assess pt and update vital signs, pt is not in room or bathroom.  EDP notified

## 2017-07-24 NOTE — Discharge Instructions (Signed)

## 2017-08-11 DIAGNOSIS — R071 Chest pain on breathing: Secondary | ICD-10-CM | POA: Diagnosis not present

## 2017-08-11 DIAGNOSIS — S335XXA Sprain of ligaments of lumbar spine, initial encounter: Secondary | ICD-10-CM | POA: Diagnosis not present

## 2017-08-11 DIAGNOSIS — Z125 Encounter for screening for malignant neoplasm of prostate: Secondary | ICD-10-CM | POA: Diagnosis not present

## 2017-08-11 DIAGNOSIS — M25519 Pain in unspecified shoulder: Secondary | ICD-10-CM | POA: Diagnosis not present

## 2017-08-11 DIAGNOSIS — Z13220 Encounter for screening for lipoid disorders: Secondary | ICD-10-CM | POA: Diagnosis not present

## 2017-08-11 DIAGNOSIS — Z131 Encounter for screening for diabetes mellitus: Secondary | ICD-10-CM | POA: Diagnosis not present

## 2018-01-17 ENCOUNTER — Encounter: Payer: Self-pay | Admitting: Internal Medicine

## 2019-08-22 ENCOUNTER — Telehealth: Payer: Self-pay

## 2019-08-22 ENCOUNTER — Ambulatory Visit
Admission: EM | Admit: 2019-08-22 | Discharge: 2019-08-22 | Disposition: A | Discharge status: Home / Self Care | Payer: Medicaid Other

## 2019-08-22 ENCOUNTER — Other Ambulatory Visit: Payer: Self-pay

## 2019-08-22 DIAGNOSIS — K047 Periapical abscess without sinus: Secondary | ICD-10-CM

## 2019-08-22 DIAGNOSIS — K0889 Other specified disorders of teeth and supporting structures: Secondary | ICD-10-CM | POA: Diagnosis not present

## 2019-08-22 MED ORDER — AMOXICILLIN-POT CLAVULANATE 875-125 MG PO TABS: 1.0000 | ORAL_TABLET | Freq: Two times a day (BID) | ORAL | 0 refills | Status: AC

## 2019-08-22 MED ORDER — CHLORHEXIDINE GLUCONATE 0.12 % MT SOLN: 15.0000 mL | Freq: Two times a day (BID) | OROMUCOSAL | 0 refills | Status: DC

## 2019-08-22 MED ORDER — IBUPROFEN 800 MG PO TABS: 800.0000 mg | ORAL_TABLET | Freq: Three times a day (TID) | ORAL | 0 refills | Status: DC

## 2019-08-22 MED ORDER — KETOROLAC TROMETHAMINE 60 MG/2ML IM SOLN
60.0000 mg | Freq: Once | INTRAMUSCULAR | Status: AC
  Administered 2019-08-22: 17:00:00 60 mg via INTRAMUSCULAR

## 2019-08-22 MED ORDER — AMOXICILLIN-POT CLAVULANATE 875-125 MG PO TABS: 1.0000 | ORAL_TABLET | Freq: Two times a day (BID) | ORAL | 0 refills | Status: DC

## 2019-08-22 NOTE — ED Provider Notes (Signed)
[facility]   [id number] 08/22/19 Arrival Time: [id number]  CC: DENTAL PAIN  SUBJECTIVE:  Victor Mathis is a 64 y.o. male who presents with lower dental pain that began this morning.  Symptoms began after eating a peanut.  Localizes pain to left lower jaw.  Has tried OTC orajel without relief.  Worse with chewing.  Does not see a dentist regularly.  Denies similar symptoms in the past. Complains of associated swelling.  Denies fever, chills, dysphagia, odynophagia, neck swelling, nausea, vomiting, chest pain, SOB.    ROS: As per HPI.  All other pertinent ROS negative.     Past Medical History:  Diagnosis Date  . History of cardiac catheterization    No significant CAD 6/13  . Hyperlipidemia    Past Surgical History:  Procedure Laterality Date  . Left eye surgery  1986   [place]  . LEFT HEART CATHETERIZATION WITH CORONARY ANGIOGRAM N/A 01/19/2012   Procedure: LEFT HEART CATHETERIZATION WITH CORONARY ANGIOGRAM;  Surgeon: Burnell Blanks, MD;  Location: [facility] CATH LAB;  Service: Cardiovascular;  Laterality: N/A;   No Known Allergies No current facility-administered medications on file prior to encounter.   Current Outpatient Medications on File Prior to Encounter  Medication Sig Dispense Refill  . benzocaine (ORAJEL) 10 % mucosal gel Use as directed 1 application in the mouth or throat as needed for mouth pain.     Social History   Socioeconomic History  . Marital status: Single    Spouse name: Not on file  . Number of children: 1  . Years of education: Not on file  . Highest education level: Not on file  Occupational History  . Occupation: Unemployed  Tobacco Use  . Smoking status: Never Smoker  . Smokeless tobacco: Never Used  Substance and Sexual Activity  . Alcohol use: Yes    Comment: Occasional beer  . Drug use: No    Comment: prior use of crack cocaine   . Sexual activity: Not on file  Other Topics Concern  . Not on file  Social History Narrative    . Not on file   Social Determinants of Health   Financial Resource Strain:   . Difficulty of Paying Living Expenses: Not on file  Food Insecurity:   . Worried About Charity fundraiser in the Last Year: Not on file  . Ran Out of Food in the Last Year: Not on file  Transportation Needs:   . Lack of Transportation (Medical): Not on file  . Lack of Transportation (Non-Medical): Not on file  Physical Activity:   . Days of Exercise per Week: Not on file  . Minutes of Exercise per Session: Not on file  Stress:   . Feeling of Stress : Not on file  Social Connections:   . Frequency of Communication with Friends and Family: Not on file  . Frequency of Social Gatherings with Friends and Family: Not on file  . Attends Religious Services: Not on file  . Active Member of Clubs or Organizations: Not on file  . Attends Archivist Meetings: Not on file  . Marital Status: Not on file  Intimate Partner Violence:   . Fear of Current or Ex-Partner: Not on file  . Emotionally Abused: Not on file  . Physically Abused: Not on file  . Sexually Abused: Not on file   Family History  Problem Relation Age of Onset  . Diabetes type II Father   . Dementia Mother  OBJECTIVE:  There were no vitals filed for this visit.  General appearance: alert; no distress HENT: normocephalic; atraumatic; dentition: poor; dental caries over left lower gums without areas of fluctuance Neck: supple without LAD Lungs: normal respirations; CTAB CV: RRR Skin: warm and dry Psychological: alert and cooperative; normal mood and affect  ASSESSMENT & PLAN:  1. Pain, dental   2. Dental infection     Meds ordered this encounter  Medications  . ketorolac (TORADOL) injection 60 mg  . DISCONTD: amoxicillin-clavulanate (AUGMENTIN) 875-125 MG tablet    Sig: Take 1 tablet by mouth every 12 (twelve) hours for 10 days.    Dispense:  20 tablet    Refill:  0    Order Specific Question:   Supervising Provider     Answer:   Raylene Everts [id number]  . DISCONTD: ibuprofen (ADVIL) 800 MG tablet    Sig: Take 1 tablet (800 mg total) by mouth 3 (three) times daily.    Dispense:  21 tablet    Refill:  0    Order Specific Question:   Supervising Provider    Answer:   Raylene Everts [id number]  . DISCONTD: chlorhexidine (PERIDEX) 0.12 % solution    Sig: Use as directed 15 mLs in the mouth or throat 2 (two) times daily.    Dispense:  473 mL    Refill:  0    Order Specific Question:   Supervising Provider    Answer:   Raylene Everts [id number]    Toradol shot given in office Augmentin prescribed.  Take as directed and to completion Ibuprofen 800 mg prescribed.  Take as needed for pain Chlorhexidine mouthwash prescribed.  Use as instructed.   Recommend soft diet until evaluated by dentist Maintain oral hygiene care Follow up with dentist as soon as possible for further evaluation and treatment  Return or go to the ED if you have any new or worsening symptoms such as fever, chills, difficulty swallowing, painful swallowing, oral or neck swelling, nausea, vomiting, chest pain, SOB, etc...  Reviewed expectations re: course of current medical issues. Questions answered. Outlined signs and symptoms indicating need for more acute intervention. Patient verbalized understanding. After Visit Summary given.   Lestine Box, PA-C 08/22/19 1752

## 2019-08-22 NOTE — Discharge Instructions (Signed)
Toradol shot given in office Augmentin prescribed.  Take as directed and to completion Ibuprofen 800 mg prescribed.  Take as needed for pain Chlorhexidine mouthwash prescribed.  Use as instructed.   Recommend soft diet until evaluated by dentist Maintain oral hygiene care Follow up with dentist as soon as possible for further evaluation and treatment  Return or go to the ED if you have any new or worsening symptoms such as fever, chills, difficulty swallowing, painful swallowing, oral or neck swelling, nausea, vomiting, chest pain, SOB, etc..Marland Kitchen
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
## 2019-08-22 NOTE — Telephone Encounter (Signed)
Pt's pharmacy called stating they could not see medications prescribed. Prescriptions resent per Lestine Box, [place].

## 2019-08-25 DIAGNOSIS — R52 Pain, unspecified: Secondary | ICD-10-CM | POA: Diagnosis not present

## 2019-08-25 DIAGNOSIS — T5991XA Toxic effect of unspecified gases, fumes and vapors, accidental (unintentional), initial encounter: Secondary | ICD-10-CM | POA: Diagnosis not present

## 2019-08-25 DIAGNOSIS — R1084 Generalized abdominal pain: Secondary | ICD-10-CM | POA: Diagnosis not present

## 2019-08-26 DIAGNOSIS — R079 Chest pain, unspecified: Secondary | ICD-10-CM | POA: Diagnosis not present

## 2019-08-26 DIAGNOSIS — R1084 Generalized abdominal pain: Secondary | ICD-10-CM | POA: Diagnosis not present

## 2019-08-26 DIAGNOSIS — R0789 Other chest pain: Secondary | ICD-10-CM | POA: Diagnosis not present

## 2019-10-11 ENCOUNTER — Encounter (HOSPITAL_COMMUNITY): Payer: Self-pay

## 2019-10-11 ENCOUNTER — Other Ambulatory Visit: Payer: Self-pay

## 2019-10-11 ENCOUNTER — Emergency Department (HOSPITAL_COMMUNITY): Payer: Medicaid Other

## 2019-10-11 ENCOUNTER — Emergency Department (HOSPITAL_COMMUNITY)
Admission: EM | Admit: 2019-10-11 | Discharge: 2019-10-11 | Disposition: A | Discharge status: Home / Self Care | Payer: Medicaid Other | Attending: Emergency Medicine | Admitting: Emergency Medicine

## 2019-10-11 DIAGNOSIS — R109 Unspecified abdominal pain: Secondary | ICD-10-CM | POA: Insufficient documentation

## 2019-10-11 DIAGNOSIS — W109XXA Fall (on) (from) unspecified stairs and steps, initial encounter: Secondary | ICD-10-CM | POA: Insufficient documentation

## 2019-10-11 DIAGNOSIS — Y999 Unspecified external cause status: Secondary | ICD-10-CM | POA: Insufficient documentation

## 2019-10-11 DIAGNOSIS — Y9301 Activity, walking, marching and hiking: Secondary | ICD-10-CM | POA: Insufficient documentation

## 2019-10-11 DIAGNOSIS — S0990XA Unspecified injury of head, initial encounter: Secondary | ICD-10-CM | POA: Diagnosis not present

## 2019-10-11 DIAGNOSIS — S299XXA Unspecified injury of thorax, initial encounter: Secondary | ICD-10-CM | POA: Diagnosis not present

## 2019-10-11 DIAGNOSIS — Y929 Unspecified place or not applicable: Secondary | ICD-10-CM | POA: Diagnosis not present

## 2019-10-11 DIAGNOSIS — Z791 Long term (current) use of non-steroidal anti-inflammatories (NSAID): Secondary | ICD-10-CM | POA: Insufficient documentation

## 2019-10-11 DIAGNOSIS — S2231XA Fracture of one rib, right side, initial encounter for closed fracture: Secondary | ICD-10-CM | POA: Diagnosis not present

## 2019-10-11 DIAGNOSIS — S199XXA Unspecified injury of neck, initial encounter: Secondary | ICD-10-CM | POA: Diagnosis not present

## 2019-10-11 DIAGNOSIS — S3991XA Unspecified injury of abdomen, initial encounter: Secondary | ICD-10-CM | POA: Diagnosis not present

## 2019-10-11 DIAGNOSIS — W19XXXA Unspecified fall, initial encounter: Secondary | ICD-10-CM

## 2019-10-11 LAB — COMPREHENSIVE METABOLIC PANEL
ALT: 22 U/L (ref 0–44)
AST: 23 U/L (ref 15–41)
Albumin: 3.7 g/dL (ref 3.5–5.0)
Alkaline Phosphatase: 66 U/L (ref 38–126)
Anion gap: 9 (ref 5–15)
BUN: 14 mg/dL (ref 8–23)
CO2: 24 mmol/L (ref 22–32)
Calcium: 8.9 mg/dL (ref 8.9–10.3)
Chloride: 104 mmol/L (ref 98–111)
Creatinine, Ser: 0.88 mg/dL (ref 0.61–1.24)
GFR calc Af Amer: >60 mL/min (ref >60)
GFR calc non Af Amer: >60 mL/min (ref >60)
Glucose, Bld: 83 mg/dL (ref 70–99)
Potassium: 3.7 mmol/L (ref 3.5–5.1)
Sodium: 137 mmol/L (ref 135–145)
Total Bilirubin: 0.7 mg/dL (ref 0.3–1.2)
Total Protein: 7 g/dL (ref 6.5–8.1)

## 2019-10-11 LAB — CBC
HCT: 44.1 % (ref 39.0–52.0)
Hemoglobin: 14.8 g/dL (ref 13.0–17.0)
MCH: 30.9 pg (ref 26.0–34.0)
MCHC: 33.6 g/dL (ref 30.0–36.0)
MCV: 92.1 fL (ref 80.0–100.0)
Platelets: 281 10*3/uL (ref 150–400)
RBC: 4.79 MIL/uL (ref 4.22–5.81)
RDW: 12.6 % (ref 11.5–15.5)
WBC: 9.3 10*3/uL (ref 4.0–10.5)
nRBC: 0 % (ref 0.0–0.2)

## 2019-10-11 MED ORDER — SODIUM CHLORIDE 0.9 % IV BOLUS
500.0000 mL | Freq: Once | INTRAVENOUS | Status: AC
  Administered 2019-10-11: 500 mL via INTRAVENOUS

## 2019-10-11 MED ORDER — HYDROMORPHONE HCL 1 MG/ML IJ SOLN
0.5000 mg | Freq: Once | INTRAMUSCULAR | Status: AC
  Administered 2019-10-11: 0.5 mg via INTRAVENOUS
  Filled 2019-10-11: qty 1

## 2019-10-11 MED ORDER — OXYCODONE-ACETAMINOPHEN 5-325 MG PO TABS: 1.0000 | ORAL_TABLET | Freq: Four times a day (QID) | ORAL | 0 refills | Status: DC | PRN

## 2019-10-11 MED ORDER — MORPHINE SULFATE (PF) 4 MG/ML IV SOLN
4.0000 mg | Freq: Once | INTRAVENOUS | Status: AC
  Administered 2019-10-11: 4 mg via INTRAVENOUS
  Filled 2019-10-11: qty 1

## 2019-10-11 MED ORDER — IOHEXOL 300 MG/ML  SOLN
100.0000 mL | Freq: Once | INTRAMUSCULAR | Status: AC | PRN
  Administered 2019-10-11: 14:00:00 100 mL via INTRAVENOUS

## 2019-10-11 MED ORDER — ONDANSETRON HCL 4 MG/2ML IJ SOLN
4.0000 mg | Freq: Once | INTRAMUSCULAR | Status: AC
  Administered 2019-10-11: 4 mg via INTRAVENOUS
  Filled 2019-10-11: qty 2
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
## 2019-10-11 NOTE — ED Notes (Signed)
Have paged respiratory  

## 2019-10-11 NOTE — Discharge Instructions (Addendum)
You were seen in the emergency department today after a fall.  Your labs were normal.  Your imaging showed findings of a right 10th rib fracture.  We are sending you home with Percocet.with pain.  Please take ibuprofen for over-the-counter dosing to help with discomfort, if this does not alleviate your pain then take the Percocet.  -Percocet-this is a narcotic/controlled substance medication that has potential addicting qualities.  We recommend that you take 1-2 tablets every 6 hours as needed for severe pain.  Do not drive or operate heavy machinery when taking this medicine as it can be sedating. Do not drink alcohol or take other sedating medications when taking this medicine for safety reasons.  Keep this out of reach of small children.  Please be aware this medicine has Tylenol in it (325 mg/tab) do not exceed the maximum dose of Tylenol in a day per over the counter recommendations should you decide to supplement with Tylenol over the counter.   We have prescribed you new medication(s) today. Discuss the medications prescribed today with your pharmacist as they can have adverse effects and interactions with your other medicines including over the counter and prescribed medications. Seek medical evaluation if you start to experience new or abnormal symptoms after taking one of these medicines, seek care immediately if you start to experience difficulty breathing, feeling of your throat closing, facial swelling, or rash as these could be indications of a more serious allergic reaction  Please use the incentive spirometer 5 times per day to help prevent pneumonia as you are at higher risk for this when you have a rib fracture.  Please follow attached rib fracture guidelines.  Follow-up with your primary care provider within 3 days.  Return to the emergency department for new or worsening symptoms including but not limited to increased pain, coughing up blood, trouble breathing, coughing up thick  yellow/green/brown sputum, fever, or any other concerns.

## 2019-10-11 NOTE — ED Provider Notes (Signed)
[facility] Provider Note   CSN: [id number] Arrival date & time: 10/11/19  1122     History Chief Complaint  Patient presents with  . Fall  . Chest Pain    Victor Mathis is a 64 y.o. male with a history of hyperlipidemia who presents to the ED s/p mechanical fall just prior to arrival with complaints of right-sided chest wall pain.  Patient states he was walking and tripped on a step resulting in a fall.  He denies any prodromal lightheadedness, dizziness, chest pain, or dyspnea.  He states that he did hit his head but did not lose consciousness.  Reports that he struck his right lateral chest on a step and is having significant pain to this area.  Pain is constant, 10 out of 10 in severity, worse with movement and with a deep breath, no alleviating factors.  Feels a bit short of breath.  He states that his vision has been a bit blurry status post head injury.  Denies numbness, weakness, incontinence, hemoptysis, neck pain, back pain, or abdominal pain. Patient is not on blood thinners.   HPI     Past Medical History:  Diagnosis Date  . History of cardiac catheterization    No significant CAD 6/13  . Hyperlipidemia     Patient Active Problem List   Diagnosis Date Noted  . Chronic pain 01/20/2012  . Muscle spasm 01/20/2012  . C. difficile diarrhea 01/20/2012  . Mixed hyperlipidemia 06/16/2011  . Chest pain 06/02/2011  . Arm pain 06/02/2011    Past Surgical History:  Procedure Laterality Date  . Left eye surgery  1986   [place]  . LEFT HEART CATHETERIZATION WITH CORONARY ANGIOGRAM N/A 01/19/2012   Procedure: LEFT HEART CATHETERIZATION WITH CORONARY ANGIOGRAM;  Surgeon: Burnell Blanks, MD;  Location: [facility] CATH LAB;  Service: Cardiovascular;  Laterality: N/A;       Family History  Problem Relation Age of Onset  . Diabetes type II Father   . Dementia Mother     Social History   Tobacco Use  . Smoking status: Never Smoker  . Smokeless  tobacco: Never Used  Substance Use Topics  . Alcohol use: Yes    Comment: Occasional beer  . Drug use: No    Comment: prior use of crack cocaine     Home Medications Prior to Admission medications   Medication Sig Start Date End Date Taking? Authorizing Provider  benzocaine (ORAJEL) 10 % mucosal gel Use as directed 1 application in the mouth or throat as needed for mouth pain.    [provider]  chlorhexidine (PERIDEX) 0.12 % solution Use as directed 15 mLs in the mouth or throat 2 (two) times daily. 08/22/19   Wurst, [place], PA-C  ibuprofen (ADVIL) 800 MG tablet Take 1 tablet (800 mg total) by mouth 3 (three) times daily. 08/22/19   Wurst, [place], PA-C    Allergies    Patient has no known allergies.  Review of Systems   Review of Systems  Constitutional: Negative for chills and fever.  Eyes: Positive for visual disturbance.  Respiratory: Positive for shortness of breath.   Cardiovascular: Positive for chest pain.  Gastrointestinal: Negative for abdominal pain, nausea and vomiting.  Musculoskeletal: Negative for back pain and neck pain.  Neurological: Positive for headaches. Negative for dizziness, seizures, syncope, speech difficulty, weakness and numbness.  All other systems reviewed and are negative.   Physical Exam Updated Vital Signs BP 126/77   Pulse 72  Temp 98.3 F (36.8 C) (Oral)   Resp 10   Ht 5\' 7"  (1.702 m)   Wt 72.6 kg   SpO2 96%   BMI 25.06 kg/m    Physical Exam Vitals and nursing note reviewed.  Constitutional:      General: He is not in acute distress.    Appearance: He is well-developed. He is not toxic-appearing.  HENT:     Head: Normocephalic and atraumatic.  Eyes:     General: Vision grossly intact. Gaze aligned appropriately.        Right eye: No discharge.        Left eye: No discharge.     Extraocular Movements: Extraocular movements intact.     Conjunctiva/sclera: Conjunctivae normal.  Cardiovascular:     Rate and Rhythm:  Normal rate and regular rhythm.     Pulses:          Radial pulses are 2+ on the right side and 2+ on the left side.  Pulmonary:     Effort: Pulmonary effort is normal. No respiratory distress.     Breath sounds: Normal breath sounds. No wheezing, rhonchi or rales.  Chest:     Chest wall: Tenderness (R lower lateral chest wall without overlying skin changes or palpable crepitus) present.  Abdominal:     General: There is no distension.     Palpations: Abdomen is soft.     Tenderness: There is abdominal tenderness in the right upper quadrant. There is right CVA tenderness. There is no guarding or rebound.  Musculoskeletal:     Cervical back: Neck supple.  Skin:    General: Skin is warm and dry.     Findings: No rash.  Neurological:     Mental Status: He is alert.     Comments: Clear speech.  CN III through XII grossly intact.  Sensation grossly intact bilateral upper and lower extremities.  5 out of 5 symmetric grip strength.  5 of 5 strength with plantar dorsiflexion bilaterally.  Negative pronator drift.  Normal finger-to-nose.  Psychiatric:        Behavior: Behavior normal.     ED Results / Procedures / Treatments   Labs (all labs ordered are listed, but only abnormal results are displayed) Labs Reviewed  COMPREHENSIVE METABOLIC PANEL  CBC    EKG None  Radiology DG Ribs Unilateral W/Chest Right  Result Date: 10/11/2019 CLINICAL DATA:  Fall with anterior lower right rib pain. EXAM: RIGHT RIBS AND CHEST - 3+ VIEW COMPARISON:  07/24/2017 chest radiograph. FINDINGS: Stable cardiomediastinal silhouette with normal heart size. No pneumothorax. No pleural effusion. Lungs appear clear, with no acute consolidative airspace disease and no pulmonary edema. The area of symptomatic concern as indicated by the patient in the low right chest wall was denoted with a metallic skin BB by the technologist. Nondisplaced acute lateral right tenth rib fracture. No additional rib fractures detected.  No focal osseous lesions. IMPRESSION: Nondisplaced acute lateral right tenth rib fracture. No pneumothorax. No active cardiopulmonary disease. Electronically Signed   By: Ilona Sorrel M.D.   On: 10/11/2019 12:16   CT Head Wo Contrast  Result Date: 10/11/2019 CLINICAL DATA:  [place]. Hit head. EXAM: CT HEAD WITHOUT CONTRAST CT CERVICAL SPINE WITHOUT CONTRAST TECHNIQUE: Multidetector CT imaging of the head and cervical spine was performed following the standard protocol without intravenous contrast. Multiplanar CT image reconstructions of the cervical spine were also generated. COMPARISON:  Head CT FINDINGS: CT HEAD FINDINGS Brain: Mild age advanced cerebral atrophy,  ventriculomegaly and periventricular white matter disease. No acute intracranial findings such as hemispheric infarction or intracranial hemorrhage. No mass lesions are identified. The brainstem and cerebellum are grossly normal. Vascular: Vascular calcifications but no aneurysm or hyperdense vessels. Skull: No skull fracture or bone lesions. Sinuses/Orbits: Scattered ethmoid and maxillary sinus disease. The mastoid air cells and middle ear cavities are clear. The globes are intact. Other: No scalp lesions or hematoma. CT CERVICAL SPINE FINDINGS Alignment: Normal Skull base and vertebrae: No acute fracture. No primary bone lesion or focal pathologic process. Moderate degenerative changes at C1-2. Soft tissues and spinal canal: No prevertebral fluid or swelling. No visible canal hematoma. Disc levels: The spinal canal is fairly generous. No large disc protrusions, spinal or foraminal stenosis. Upper chest: The lung apices are grossly clear. Other: No neck mass or adenopathy. The thyroid gland appears normal. IMPRESSION: 1. Mild age advanced cerebral atrophy, ventriculomegaly and periventricular white matter disease. 2. No acute intracranial findings or skull fracture. 3. Normal alignment of the cervical vertebral bodies and no acute cervical spine  fracture. Electronically Signed   By: Marijo Sanes M.D.   On: 10/11/2019 14:55   CT Chest W Contrast  Result Date: 10/11/2019 CLINICAL DATA:  [place]. Right-sided chest and abdominal pain. EXAM: CT CHEST, ABDOMEN, AND PELVIS WITH CONTRAST TECHNIQUE: Multidetector CT imaging of the chest, abdomen and pelvis was performed following the standard protocol during bolus administration of intravenous contrast. CONTRAST:  138mL OMNIPAQUE IOHEXOL 300 MG/ML  SOLN COMPARISON:  05/29/2012 FINDINGS: CT CHEST FINDINGS Cardiovascular: The heart is normal in size. No pericardial effusion. Normal thoracic aorta and branch vessels. No definite coronary artery calcifications. The pulmonary arteries appear normal. Mediastinum/Nodes: No mediastinal or hilar mass or adenopathy or hematoma. The esophagus is grossly normal. Lungs/Pleura: The lungs are clear. Minimal dependent basilar subpleural atelectasis but no pulmonary contusion, infiltrate, pleural effusion or pneumothorax. No worrisome pulmonary lesions. No pulmonary nodules Musculoskeletal: The ribs are intact. No sternal fractures are identified. The thoracic vertebral bodies are normally aligned. No fractures. No chest wall hematoma. CT ABDOMEN PELVIS FINDINGS Hepatobiliary: No focal hepatic lesions. No evidence of acute hepatic injury. No perihepatic fluid collections. No intra or extrahepatic biliary dilatation. The gallbladder appears normal. Pancreas: No mass, inflammation or ductal dilatation. No acute injury or peripancreatic fluid collection. Spleen: Normal size. No focal lesions or acute injury. No perisplenic fluid collections. Adrenals/Urinary Tract: The adrenal glands and kidneys are normal. The bladder is unremarkable. Stomach/Bowel: The stomach, duodenum, small bowel and colon are unremarkable. No acute inflammatory changes, mass lesions or obstructive findings. No secondary signs of acute bowel injury such as free fluid or free air. Moderate sigmoid colon  diverticulosis but no findings for acute diverticulitis. The terminal ileum and appendix are normal. Vascular/Lymphatic: The aorta is normal in caliber. No dissection. The branch vessels are patent. The major venous structures are patent. No mesenteric or retroperitoneal mass or adenopathy. Small scattered lymph nodes are noted. Reproductive: The prostate gland and seminal vesicles are unremarkable. Other: No pelvic mass or adenopathy. No free pelvic fluid collections. No inguinal mass or adenopathy. No abdominal wall hernia or subcutaneous lesions. Musculoskeletal: No significant bony findings. The pubic symphysis and SI joints are intact. Both hips are normally located. IMPRESSION: Unremarkable CT examination of the chest, abdomen and pelvis. No acute injury is identified. Electronically Signed   By: Marijo Sanes M.D.   On: 10/11/2019 15:08   CT Cervical Spine Wo Contrast  Result Date: 10/11/2019 CLINICAL DATA:  [place]. Hit  head. EXAM: CT HEAD WITHOUT CONTRAST CT CERVICAL SPINE WITHOUT CONTRAST TECHNIQUE: Multidetector CT imaging of the head and cervical spine was performed following the standard protocol without intravenous contrast. Multiplanar CT image reconstructions of the cervical spine were also generated. COMPARISON:  Head CT FINDINGS: CT HEAD FINDINGS Brain: Mild age advanced cerebral atrophy, ventriculomegaly and periventricular white matter disease. No acute intracranial findings such as hemispheric infarction or intracranial hemorrhage. No mass lesions are identified. The brainstem and cerebellum are grossly normal. Vascular: Vascular calcifications but no aneurysm or hyperdense vessels. Skull: No skull fracture or bone lesions. Sinuses/Orbits: Scattered ethmoid and maxillary sinus disease. The mastoid air cells and middle ear cavities are clear. The globes are intact. Other: No scalp lesions or hematoma. CT CERVICAL SPINE FINDINGS Alignment: Normal Skull base and vertebrae: No acute fracture. No  primary bone lesion or focal pathologic process. Moderate degenerative changes at C1-2. Soft tissues and spinal canal: No prevertebral fluid or swelling. No visible canal hematoma. Disc levels: The spinal canal is fairly generous. No large disc protrusions, spinal or foraminal stenosis. Upper chest: The lung apices are grossly clear. Other: No neck mass or adenopathy. The thyroid gland appears normal. IMPRESSION: 1. Mild age advanced cerebral atrophy, ventriculomegaly and periventricular white matter disease. 2. No acute intracranial findings or skull fracture. 3. Normal alignment of the cervical vertebral bodies and no acute cervical spine fracture. Electronically Signed   By: Marijo Sanes M.D.   On: 10/11/2019 14:55   CT Abdomen Pelvis W Contrast  Result Date: 10/11/2019 CLINICAL DATA:  [place]. Right-sided chest and abdominal pain. EXAM: CT CHEST, ABDOMEN, AND PELVIS WITH CONTRAST TECHNIQUE: Multidetector CT imaging of the chest, abdomen and pelvis was performed following the standard protocol during bolus administration of intravenous contrast. CONTRAST:  174mL OMNIPAQUE IOHEXOL 300 MG/ML  SOLN COMPARISON:  05/29/2012 FINDINGS: CT CHEST FINDINGS Cardiovascular: The heart is normal in size. No pericardial effusion. Normal thoracic aorta and branch vessels. No definite coronary artery calcifications. The pulmonary arteries appear normal. Mediastinum/Nodes: No mediastinal or hilar mass or adenopathy or hematoma. The esophagus is grossly normal. Lungs/Pleura: The lungs are clear. Minimal dependent basilar subpleural atelectasis but no pulmonary contusion, infiltrate, pleural effusion or pneumothorax. No worrisome pulmonary lesions. No pulmonary nodules Musculoskeletal: The ribs are intact. No sternal fractures are identified. The thoracic vertebral bodies are normally aligned. No fractures. No chest wall hematoma. CT ABDOMEN PELVIS FINDINGS Hepatobiliary: No focal hepatic lesions. No evidence of acute hepatic  injury. No perihepatic fluid collections. No intra or extrahepatic biliary dilatation. The gallbladder appears normal. Pancreas: No mass, inflammation or ductal dilatation. No acute injury or peripancreatic fluid collection. Spleen: Normal size. No focal lesions or acute injury. No perisplenic fluid collections. Adrenals/Urinary Tract: The adrenal glands and kidneys are normal. The bladder is unremarkable. Stomach/Bowel: The stomach, duodenum, small bowel and colon are unremarkable. No acute inflammatory changes, mass lesions or obstructive findings. No secondary signs of acute bowel injury such as free fluid or free air. Moderate sigmoid colon diverticulosis but no findings for acute diverticulitis. The terminal ileum and appendix are normal. Vascular/Lymphatic: The aorta is normal in caliber. No dissection. The branch vessels are patent. The major venous structures are patent. No mesenteric or retroperitoneal mass or adenopathy. Small scattered lymph nodes are noted. Reproductive: The prostate gland and seminal vesicles are unremarkable. Other: No pelvic mass or adenopathy. No free pelvic fluid collections. No inguinal mass or adenopathy. No abdominal wall hernia or subcutaneous lesions. Musculoskeletal: No significant bony findings. The pubic  symphysis and SI joints are intact. Both hips are normally located. IMPRESSION: Unremarkable CT examination of the chest, abdomen and pelvis. No acute injury is identified. Electronically Signed   By: Marijo Sanes M.D.   On: 10/11/2019 15:08    Procedures Procedures (including critical care time)  Medications Ordered in ED Medications  sodium chloride 0.9 % bolus 500 mL (0 mLs Intravenous Stopped 10/11/19 1521)  ondansetron (ZOFRAN) injection 4 mg (4 mg Intravenous Given 10/11/19 1305)  morphine 4 MG/ML injection 4 mg (4 mg Intravenous Given 10/11/19 1305)  iohexol (OMNIPAQUE) 300 MG/ML solution 100 mL (100 mLs Intravenous Contrast Given 10/11/19 1420)  HYDROmorphone  (DILAUDID) injection 0.5 mg (0.5 mg Intravenous Given 10/11/19 1606)    ED Course  I have reviewed the triage vital signs and the nursing notes.  Pertinent labs & imaging results that were available during my care of the patient were reviewed by me and considered in my medical decision making (see chart for details).    MDM Rules/Calculators/A&P                      Patient presents to the ED status post mechanical fall with primary complaint of R sided rib pain. Patient is nontoxic, vitals without significant abnormality on my assessment. No acute distress noted at rest but patient does appear quite uncomfortable with movement on stretcher. Xray per triage shows R 10th lateral rib fx. Based on H&P will proceed with trauma scans. Analgesics ordered.  Labs unremarkable CT imaging without acute injury. Based on initial xray and focal tenderness will treat as presumed rib fx. Patient feeling better after pain control, maintaining SpO2. Will discharge home with rib fx guidelines, spirometer & percocet. I discussed results, treatment plan, need for follow-up, and return precautions with the patient. Provided opportunity for questions, patient confirmed understanding and is in agreement with plan.   Findings and plan of care discussed with supervising physician Dr. Lacinda Axon who is in agreement.   Final Clinical Impression(s) / ED Diagnoses Final diagnoses:  Fall, initial encounter  Closed fracture of one rib of right side, initial encounter    Rx / DC Orders ED Discharge Orders         Ordered    oxyCODONE-acetaminophen (PERCOCET/ROXICET) 5-325 MG tablet  Every 6 hours PRN     10/11/19 [address] Glynda Jaeger, PA-C 10/11/19 1720    Nat Christen, MD 10/16/19 [phone]

## 2020-09-22 ENCOUNTER — Encounter (HOSPITAL_COMMUNITY): Payer: Self-pay | Admitting: *Deleted

## 2020-09-22 ENCOUNTER — Other Ambulatory Visit: Payer: Self-pay

## 2020-09-22 ENCOUNTER — Emergency Department (HOSPITAL_COMMUNITY): Payer: Medicare Other

## 2020-09-22 ENCOUNTER — Emergency Department (HOSPITAL_COMMUNITY)
Admission: EM | Admit: 2020-09-22 | Discharge: 2020-09-22 | Disposition: A | Discharge status: Home / Self Care | Payer: Medicare Other | Attending: Emergency Medicine | Admitting: Emergency Medicine

## 2020-09-22 DIAGNOSIS — N4889 Other specified disorders of penis: Secondary | ICD-10-CM | POA: Diagnosis not present

## 2020-09-22 DIAGNOSIS — N4829 Other inflammatory disorders of penis: Secondary | ICD-10-CM | POA: Diagnosis present

## 2020-09-22 DIAGNOSIS — N481 Balanitis: Secondary | ICD-10-CM | POA: Diagnosis not present

## 2020-09-22 LAB — URINALYSIS, ROUTINE W REFLEX MICROSCOPIC
Bilirubin Urine: NEGATIVE
Glucose, UA: NEGATIVE mg/dL
Hgb urine dipstick: NEGATIVE
Ketones, ur: NEGATIVE mg/dL
Leukocytes,Ua: NEGATIVE
Nitrite: NEGATIVE
Protein, ur: NEGATIVE mg/dL
Specific Gravity, Urine: 1.003 — ABNORMAL LOW (ref 1.005–1.030)
pH: 8 (ref 5.0–8.0)

## 2020-09-22 MED ORDER — HYDROMORPHONE HCL 1 MG/ML IJ SOLN
1.0000 mg | Freq: Once | INTRAMUSCULAR | Status: AC
  Administered 2020-09-22: 1 mg via INTRAVENOUS
  Filled 2020-09-22: qty 1

## 2020-09-22 MED ORDER — CLOTRIMAZOLE-BETAMETHASONE 1-0.05 % EX CREA: TOPICAL_CREAM | CUTANEOUS | 0 refills | Status: DC

## 2020-09-22 NOTE — ED Triage Notes (Signed)
Chest pain onset this am. 

## 2020-09-22 NOTE — Discharge Instructions (Addendum)
You are seen today for balanitis, please use the cream as we discussed.  Please use the attached instructions.  I want you to follow-up with urology, their information is provided.  Please schedule an appointment with them.  Make sure to use proper hygiene, use the attached instructions.  Take Tylenol or ibuprofen as directed on the bottle for pain.  If you have any new or worsening concerning symptoms like back to the emergency department.  Your STD results will come on MyChart in the next 2 days, please keep an eye on this.  Contact a health care provider if: Your symptoms get worse or do not improve with home care. You develop chills or a fever. You have trouble urinating. You cannot retract your foreskin.

## 2020-09-22 NOTE — ED Notes (Addendum)
Patient reports he does not have chest pain and that was not his complaint upon arrival.  Reports he has never had chest pain  States he had unprotected sex on Sunday with a girl, then yesterday began having burning in his penis when he peed and then his penis began swelling  Denies fevers

## 2020-09-22 NOTE — ED Provider Notes (Signed)
[name]   CSN: [id number] Arrival date & time: 09/22/20  1408     History Chief Complaint  Patient presents with  . Penis Pain    Victor Mathis is a 65 y.o. male from past medical history of hyperlipidemia, chronic pain presents emergency department today for penile pain.  Triage note states that patient is here for chest pain, however patient states that this is not true.  States that he is unsure why the triage note states that he is here for chest pain.  Nursing states that they spoke to triage nurse about this, patient states that he has no chest pain or shortness of breath.   Patient states that his penile pain started yesterday, got worse this morning.  Patient states that tip of penis and glans and foreskin are hurting.  States that he is breathing heavily due to his pain.  Has not take anything for this.  Denies any penile discharge.  States that he has had an STD before, long time ago.  Denies any penile lesions.  Denies any fevers, chills, nausea or vomiting.  Denies any back pain.  States that it does feel uncomfortable for him to urinate, no urinary frequency or hesitancy.  No incontinence.  Denies any chest pain or shortness of breath.  Denies any scrotal pain, scrotal swelling, scrotal redness.  Has no pain to the shaft of his penis.  No other complaints.   HPI     Past Medical History:  Diagnosis Date  . History of cardiac catheterization    No significant CAD 6/13  . Hyperlipidemia     Patient Active Problem List   Diagnosis Date Noted  . Chronic pain 01/20/2012  . Muscle spasm 01/20/2012  . C. difficile diarrhea 01/20/2012  . Mixed hyperlipidemia 06/16/2011  . Chest pain 06/02/2011  . Arm pain 06/02/2011    Past Surgical History:  Procedure Laterality Date  . Left eye surgery  1986   [place]  . LEFT HEART CATHETERIZATION WITH CORONARY ANGIOGRAM N/A 01/19/2012   Procedure: LEFT HEART CATHETERIZATION WITH CORONARY  ANGIOGRAM;  Surgeon: Burnell Blanks, MD;  Location: [facility] CATH LAB;  Service: Cardiovascular;  Laterality: N/A;       Family History  Problem Relation Age of Onset  . Diabetes type II Father   . Dementia Mother     Social History   Tobacco Use  . Smoking status: Never Smoker  . Smokeless tobacco: Never Used  Substance Use Topics  . Alcohol use: Yes    Comment: Occasional beer  . Drug use: No    Comment: prior use of crack cocaine     Home Medications Prior to Admission medications   Medication Sig Start Date End Date Taking? Authorizing Provider  clotrimazole-betamethasone (LOTRISONE) cream Apply to affected area 2 times daily prn 09/22/20  Yes Alfredia Client, PA-C  multivitamin-iron-minerals-folic acid (CENTRUM) chewable tablet Chew 0.5 tablets by mouth daily.   Yes [provider]  benzocaine (ORAJEL) 10 % mucosal gel Use as directed 1 application in the mouth or throat as needed for mouth pain. Patient not taking: Reported on 09/22/2020    [provider]  chlorhexidine (PERIDEX) 0.12 % solution Use as directed 15 mLs in the mouth or throat 2 (two) times daily. Patient not taking: Reported on 09/22/2020 08/22/19   Wurst, [place], PA-C  ibuprofen (ADVIL) 800 MG tablet Take 1 tablet (800 mg total) by mouth 3 (three) times daily. Patient not taking:  Reported on 09/22/2020 08/22/19   Lestine Box, PA-C  oxyCODONE-acetaminophen (PERCOCET/ROXICET) 5-325 MG tablet Take 1-2 tablets by mouth every 6 (six) hours as needed for severe pain. Patient not taking: Reported on 09/22/2020 10/11/19   Petrucelli, Glynda Jaeger, PA-C    Allergies    Patient has no known allergies.  Review of Systems   Review of Systems  Constitutional: Negative for chills, diaphoresis, fatigue and fever.  HENT: Negative for congestion, sore throat and trouble swallowing.   Eyes: Negative for pain and visual disturbance.  Respiratory: Negative for cough, shortness of breath and wheezing.    Cardiovascular: Negative for chest pain, palpitations and leg swelling.  Gastrointestinal: Negative for abdominal distention, abdominal pain, diarrhea, nausea and vomiting.  Genitourinary: Positive for penile pain and penile swelling. Negative for decreased urine volume, difficulty urinating, dysuria, enuresis, flank pain, penile discharge, scrotal swelling, testicular pain and urgency.  Musculoskeletal: Negative for back pain, neck pain and neck stiffness.  Skin: Negative for pallor.  Neurological: Negative for dizziness, speech difficulty, weakness and headaches.  Psychiatric/Behavioral: Negative for confusion.    Physical Exam Updated Vital Signs BP 126/88   Pulse 77   Temp (!) 97.4 F (36.3 C) (Oral)   Resp (!) 22   SpO2 98%   Physical Exam Exam conducted with a chaperone present.  Constitutional:      General: He is not in acute distress.    Appearance: Normal appearance. He is not ill-appearing, toxic-appearing or diaphoretic.  Cardiovascular:     Rate and Rhythm: Normal rate and regular rhythm.     Pulses: Normal pulses.  Pulmonary:     Effort: Pulmonary effort is normal. Tachypnea present.     Breath sounds: Normal breath sounds.     Comments: Patient is tachypneic, states that he is breathing like this due to severe pain. Abdominal:     Hernia: There is no hernia in the left inguinal area or right inguinal area.  Genitourinary:    Penis: Uncircumcised. Erythema, tenderness and swelling present. No lesions.      Testes: Normal. Cremasteric reflex is present.     Epididymis:     Right: Normal.     Left: Normal.     Comments: Chaperone present.  Patient is not circumcised.  Patient with foreskin inflamed and erythematous, able to retract foreskin fully, however when foreskin is extended it does not cover tip of penis.  No true paraphimosis.  Glans is erythematous and tender to touch.  No discharge coming out of urethral meatus.  No tenderness to shaft of penis.  No  discharge.  No lesions.  No scrotal tenderness or scrotal swelling.  Testes with cremaster reflex present.  No inflamed epididymis.  No adenopathy. Musculoskeletal:        General: Normal range of motion.  Lymphadenopathy:     Lower Body: No right inguinal adenopathy. No left inguinal adenopathy.  Skin:    General: Skin is warm and dry.     Capillary Refill: Capillary refill takes less than 2 seconds.  Neurological:     General: No focal deficit present.     Mental Status: He is alert and oriented to person, place, and time.  Psychiatric:        Mood and Affect: Mood normal.        Behavior: Behavior normal.        Thought Content: Thought content normal.     ED Results / Procedures / Treatments   Labs (all labs ordered are listed,  but only abnormal results are displayed) Labs Reviewed  URINALYSIS, ROUTINE W REFLEX MICROSCOPIC - Abnormal; Notable for the following components:      Result Value   Color, Urine STRAW (*)    Specific Gravity, Urine 1.003 (*)    All other components within normal limits  GC/CHLAMYDIA PROBE AMP ([place]) NOT AT [facility]    EKG EKG Interpretation  Date/Time:  Tuesday September 22 2020 14:09:19 EST Ventricular Rate:  70 PR Interval:    QRS Duration: 86 QT Interval:  380 QTC Calculation: 410 R Axis:   66 Text Interpretation: Sinus rhythm Abnormal ECG Artifact No significant change since last tracing Confirmed by Calvert Cantor [phone]) on 09/22/2020 2:20:31 PM   Radiology DG Chest 2 View  Result Date: 09/22/2020 CLINICAL DATA:  Chest pain since this morning. EXAM: CHEST - 2 VIEW COMPARISON:  10/11/2019 FINDINGS: The cardiac silhouette, mediastinal and hilar contours are normal. The lungs are clear. No infiltrates or effusions. No pulmonary edema. The bony thorax is intact. IMPRESSION: No acute cardiopulmonary findings. Electronically Signed   By: Marijo Sanes M.D.   On: 09/22/2020 15:05    Procedures Procedures   Medications Ordered in  ED Medications  HYDROmorphone (DILAUDID) injection 1 mg (1 mg Intravenous Given 09/22/20 1501)    ED Course  I have reviewed the triage vital signs and the nursing notes.  Pertinent labs & imaging results that were available during my care of the patient were reviewed by me and considered in my medical decision making (see chart for details).    MDM Rules/Calculators/A&P                         Victor Mathis is a 65 y.o. male from past medical history of hyperlipidemia, chronic pain presents emergency department today for penile pain.  I am able to fully retract foreskin, however unable to fully extend foreskin, no true paraphimosis.  Most likely balanitis.  Dr. Karlton Lemon also evaluated patient.  Morphine given.  Urinalysis and GC chlamydia collected.   Spoke to Dr. Gloriann Loan, urology who recommended Lotrisone cream.  Patient follow-up with urology.  Patient agreeable for this.After morphine given, patient appears much better, respiratory rate decreased down to 11.  Appears better, symptomatic treatment discussed in regards to balanitis.  Patient be discharged at this time. Unfortunately patient was triaged as chest pain, chest x-ray and EKG were done.  Patient states that he never had any chest pain, this was a mistake charge nurse is aware about.  Fortunately labs were not drawn yet.  I discussed this case with my attending physician who cosigned this note including patient's presenting symptoms, physical exam, and planned diagnostics and interventions. Attending physician stated agreement with plan or made changes to plan which were implemented.   Attending physician assessed patient at bedside.  Final Clinical Impression(s) / ED Diagnoses Final diagnoses:  Foreskin swelling  Balanitis    Rx / DC Orders ED Discharge Orders         Ordered    clotrimazole-betamethasone (LOTRISONE) cream        09/22/20 1601           Alfredia Client, PA-C 09/22/20 1657    Truddie Hidden,  MD 09/23/20 [phone]

## 2020-10-09 IMAGING — CT CT HEAD W/O CM
2 series · 13 of 41 positions shown, 16 images · non-contrast
Comparison: Head CT

CLINICAL DATA: Fell. Hit head.

EXAM:
CT HEAD WITHOUT CONTRAST
CT CERVICAL SPINE WITHOUT CONTRAST
TECHNIQUE: Multidetector CT imaging of the head and cervical spine was
performed following the standard protocol without intravenous
contrast. Multiplanar CT image reconstructions of the cervical spine
were also generated.

[Series 4: coronal soft · coronal · 0.32mm/px · 10 of 75 slices shown, 13 images]
[im 9/75  brain]
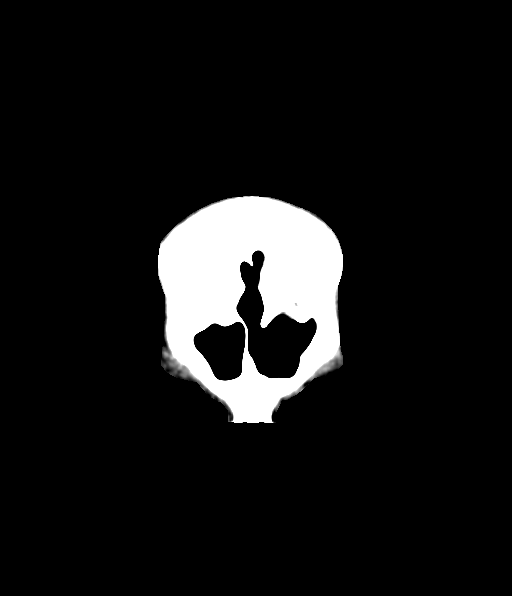
[im 9/75  bone]
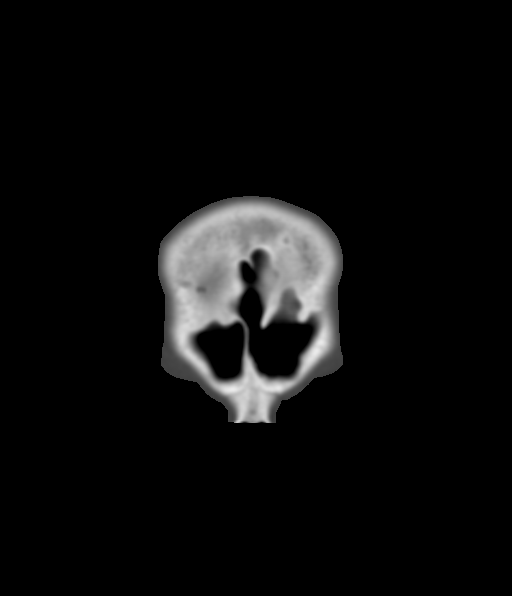
[im 17/75  brain]
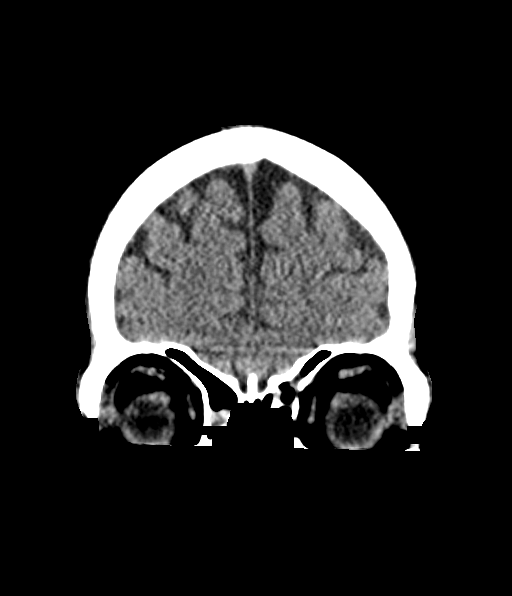
[im 22/75  brain]
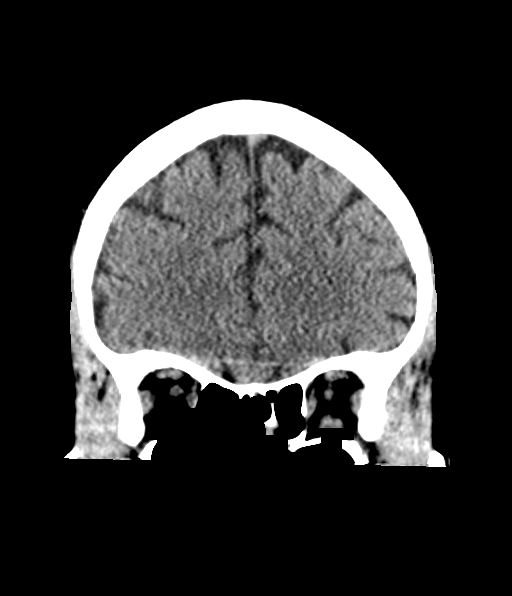
[im 27/75  brain]
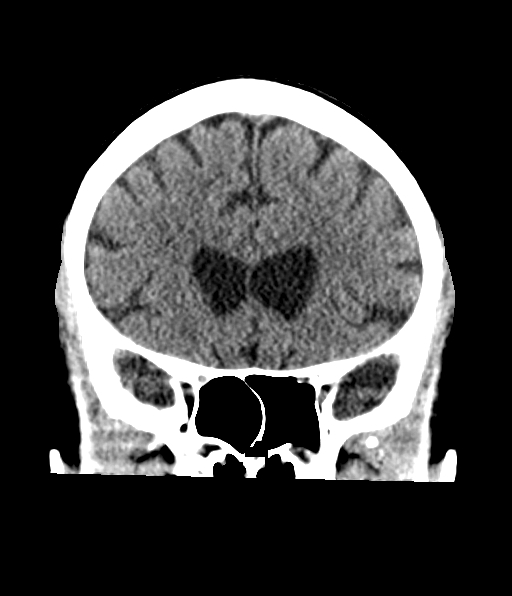
[im 33/75  brain]
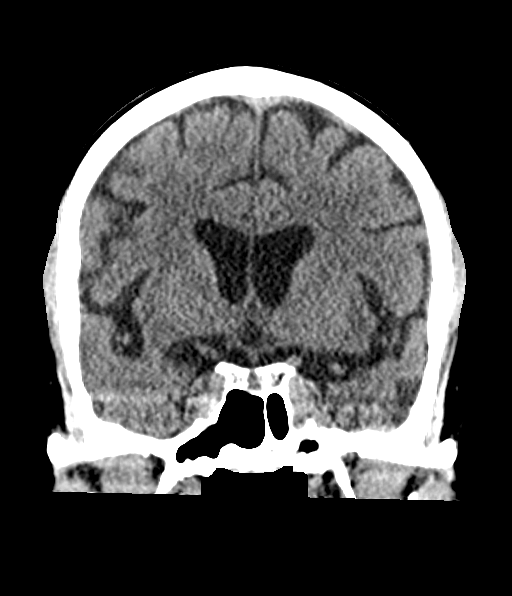
[im 33/75  bone]
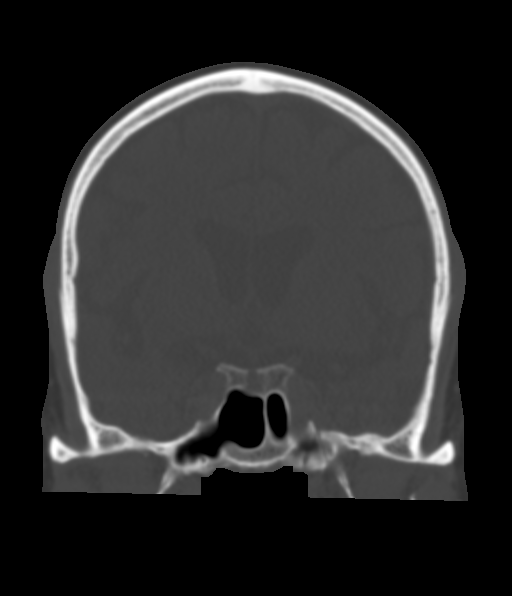
[im 42/75  brain]
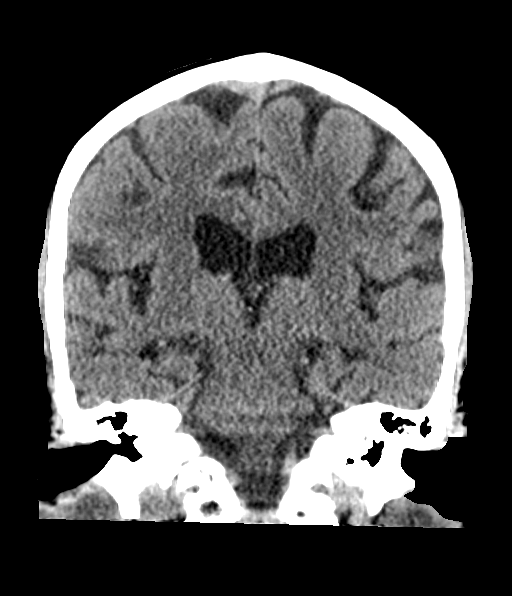
[im 48/75  brain]
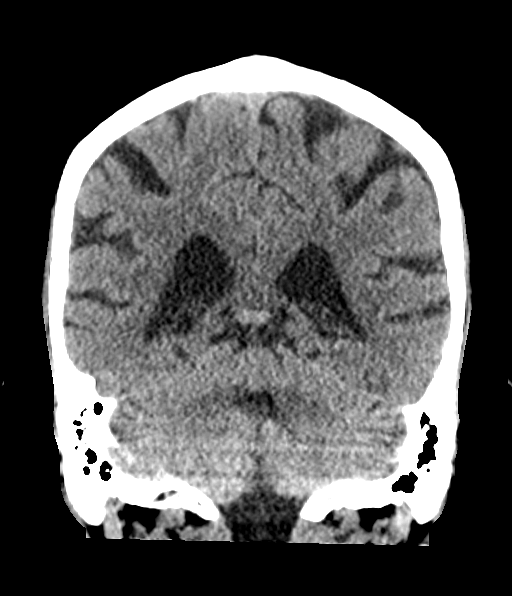
[im 53/75  brain]
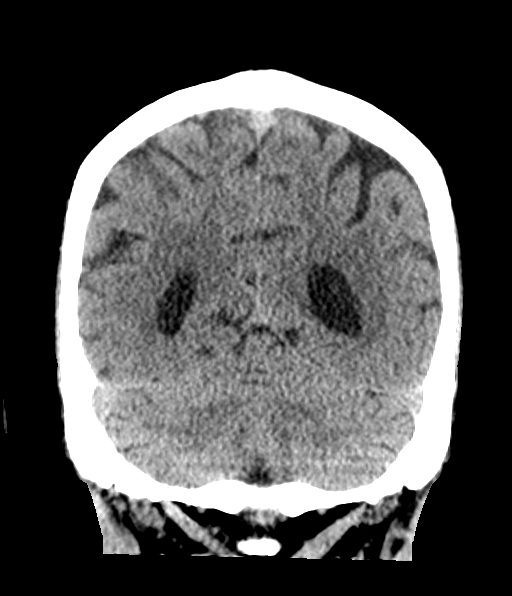
[im 58/75  brain]
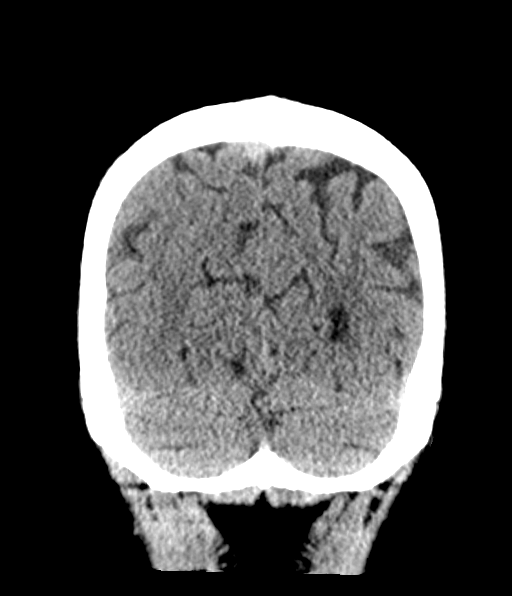
[im 58/75  bone]
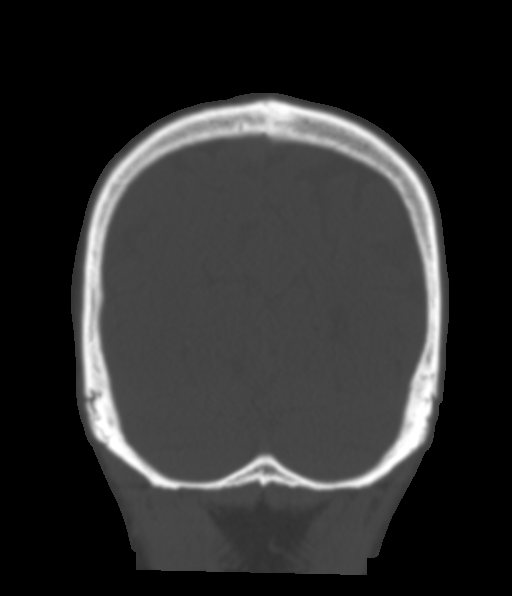
[im 66/75  brain]
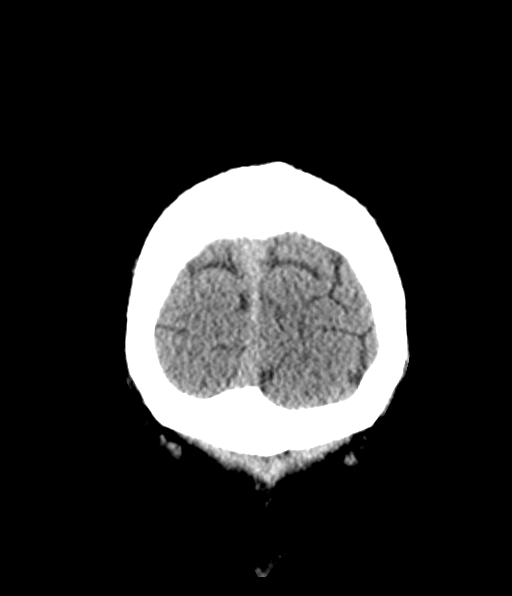

[Series 5: sagittal soft · sagittal · 0.38mm/px · 3 of 51 slices shown]
[im 23/51  brain]
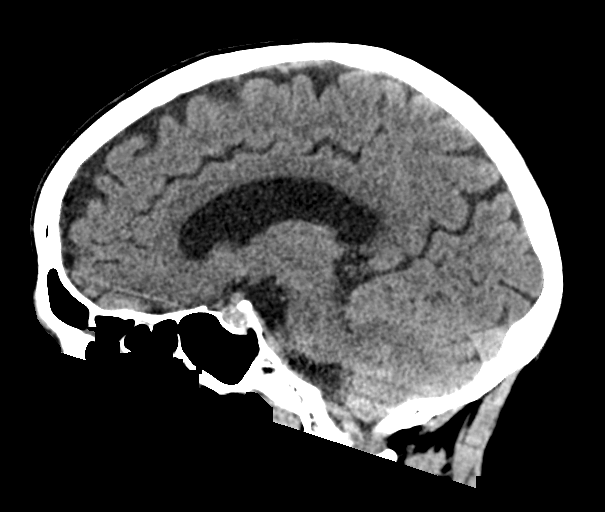
[im 26/51  brain]
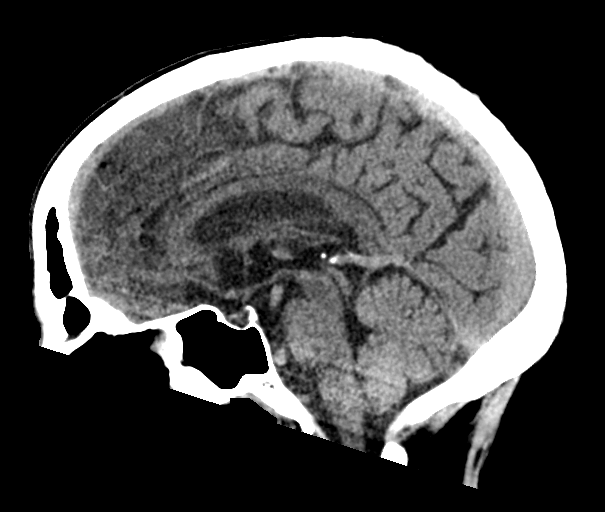
[im 28/51  brain]
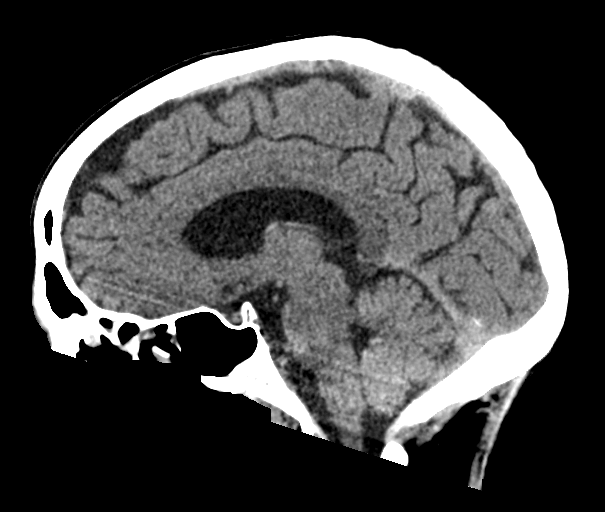

[13 of 41 positions shown; findings below may reference images not displayed]

FINDINGS: CT HEAD FINDINGS

Brain: Mild age advanced cerebral atrophy, ventriculomegaly and
periventricular white matter disease. No acute intracranial findings
such as hemispheric infarction or intracranial hemorrhage. No mass
lesions are identified. The brainstem and cerebellum are grossly
normal.

Vascular: Vascular calcifications but no aneurysm or hyperdense
vessels.

Skull: No skull fracture or bone lesions.

Sinuses/Orbits: Scattered ethmoid and maxillary sinus disease. The
mastoid air cells and middle ear cavities are clear. The globes are
intact.

Other: No scalp lesions or hematoma.

CT CERVICAL SPINE FINDINGS

Alignment: Normal

Skull base and vertebrae: No acute fracture. No primary bone lesion
or focal pathologic process. Moderate degenerative changes at C1-2.

Soft tissues and spinal canal: No prevertebral fluid or swelling. No
visible canal hematoma.

Disc levels: The spinal canal is fairly generous. No large disc
protrusions, spinal or foraminal stenosis.

Upper chest: The lung apices are grossly clear.

Other: No neck mass or adenopathy. The thyroid gland appears normal.
IMPRESSION: 1. Mild age advanced cerebral atrophy, ventriculomegaly and
periventricular white matter disease.
2. No acute intracranial findings or skull fracture.
3. Normal alignment of the cervical vertebral bodies and no acute
cervical spine fracture.

## 2021-09-21 IMAGING — DX DG CHEST 2V
2 series · 2 of 2 positions shown · non-contrast
Comparison: 10/11/2019

CLINICAL DATA: Chest pain since this morning.

EXAM:
CHEST - 2 VIEW

[chest pa]
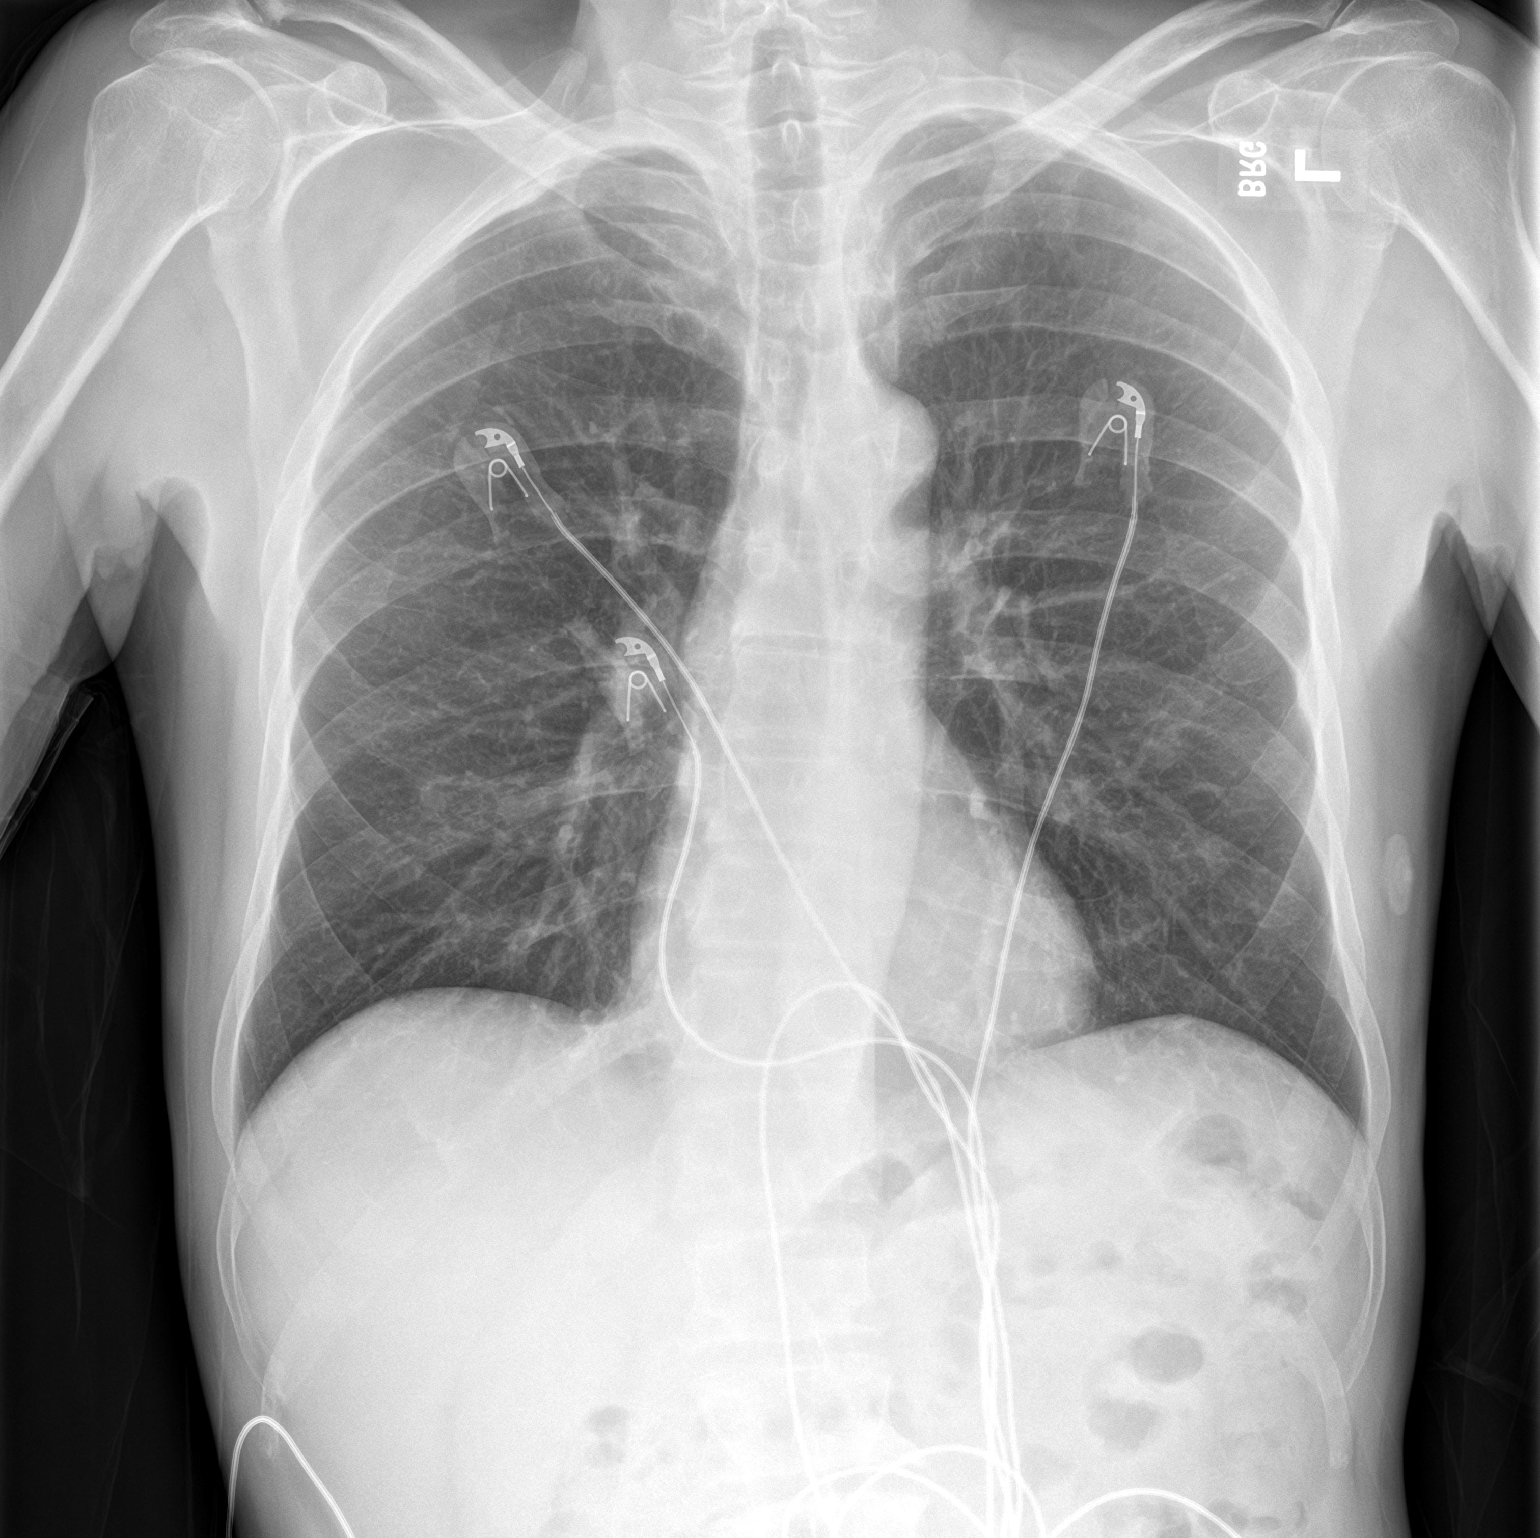

[chest lat]
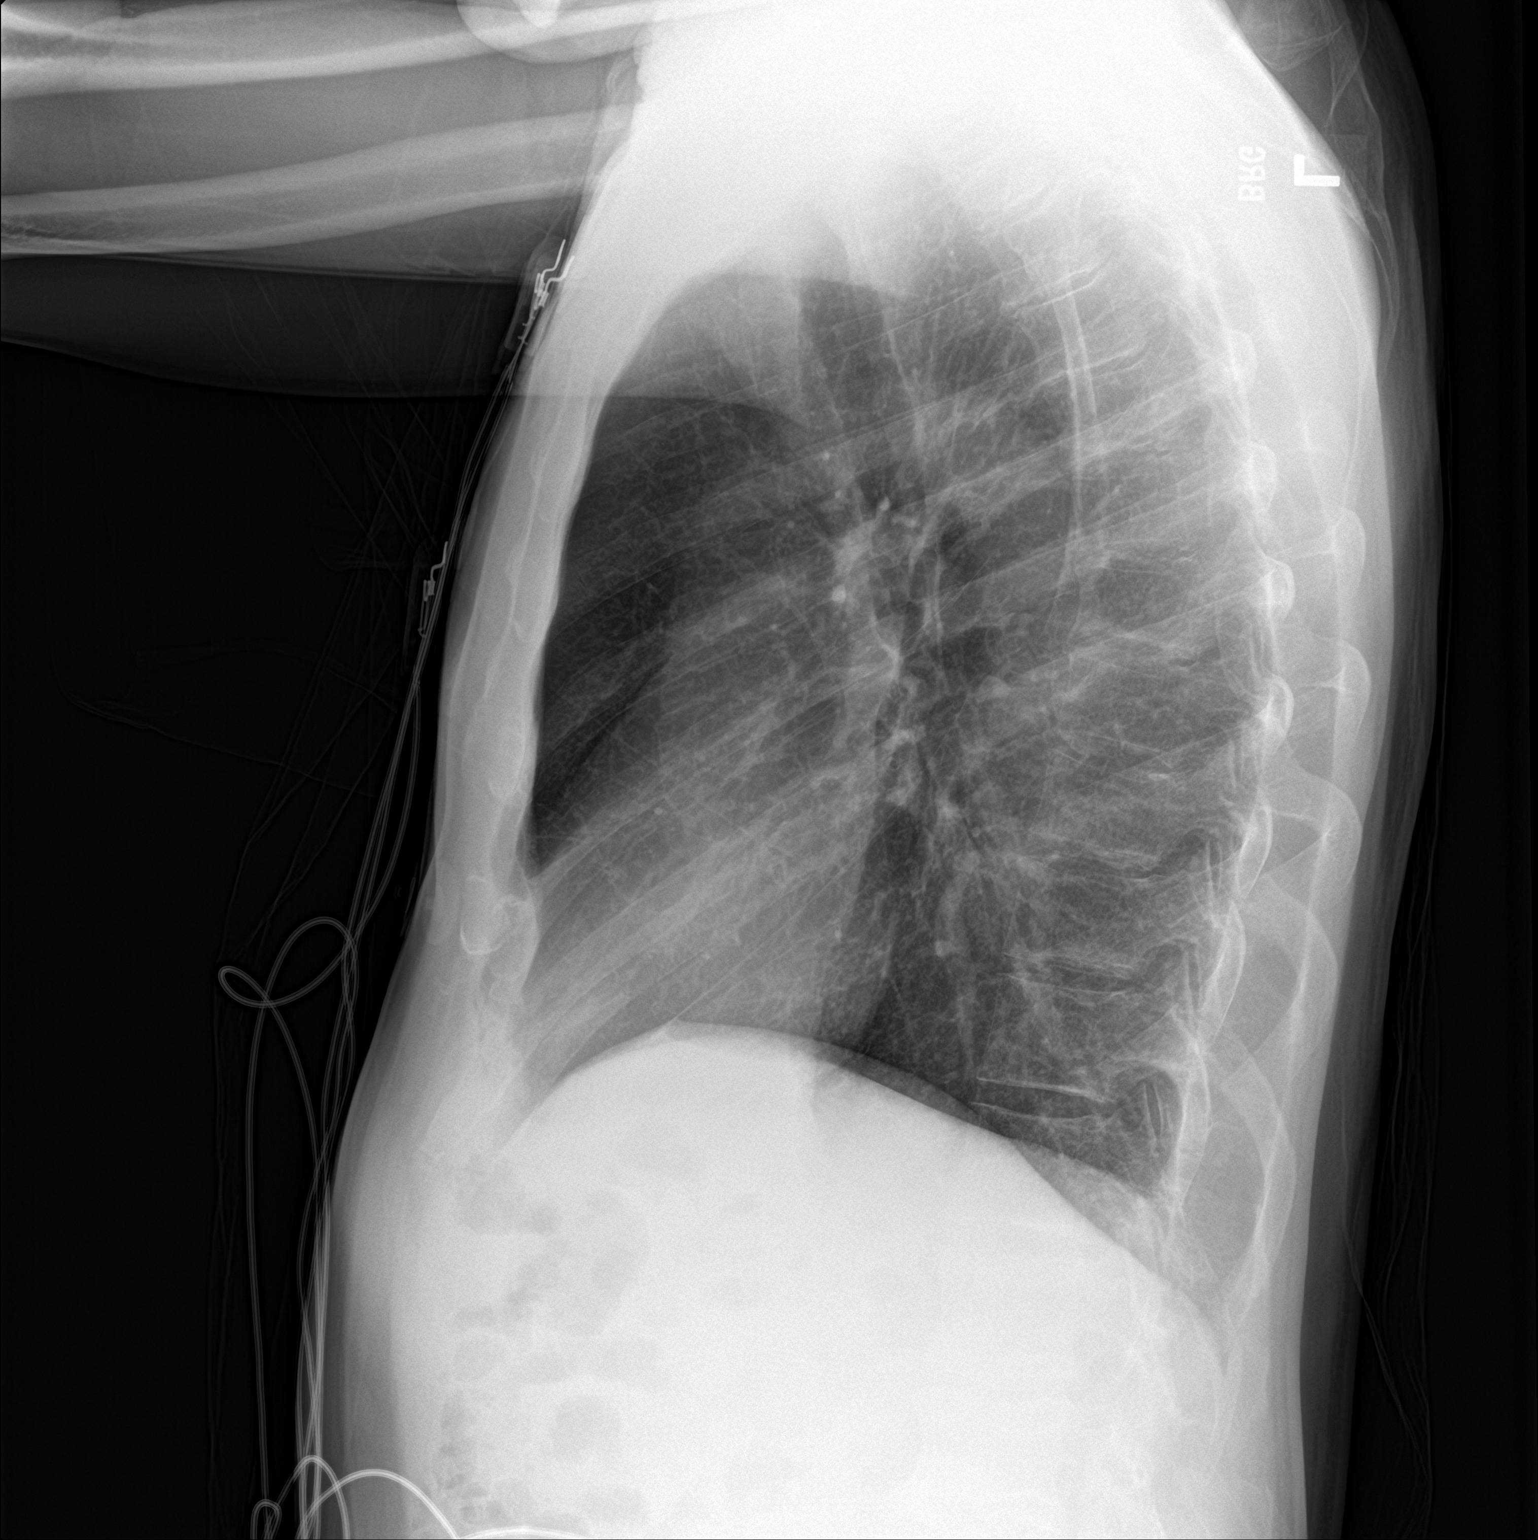

[2 of 2 positions shown; findings below may reference images not displayed]

FINDINGS: The cardiac silhouette, mediastinal and hilar contours are normal.
The lungs are clear. No infiltrates or effusions. No pulmonary
edema. The bony thorax is intact.
IMPRESSION: No acute cardiopulmonary findings.

## 2024-02-23 ENCOUNTER — Emergency Department (HOSPITAL_COMMUNITY)
Admission: EM | Admit: 2024-02-23 | Discharge: 2024-02-23 | Disposition: A | Discharge status: Home / Self Care | Attending: Emergency Medicine | Admitting: Emergency Medicine

## 2024-02-23 ENCOUNTER — Other Ambulatory Visit: Payer: Self-pay

## 2024-02-23 ENCOUNTER — Emergency Department (HOSPITAL_COMMUNITY)

## 2024-02-23 DIAGNOSIS — E871 Hypo-osmolality and hyponatremia: Secondary | ICD-10-CM | POA: Insufficient documentation

## 2024-02-23 DIAGNOSIS — K2900 Acute gastritis without bleeding: Secondary | ICD-10-CM | POA: Insufficient documentation

## 2024-02-23 DIAGNOSIS — R1010 Upper abdominal pain, unspecified: Secondary | ICD-10-CM | POA: Diagnosis present

## 2024-02-23 LAB — CBC WITH DIFFERENTIAL/PLATELET
Abs Immature Granulocytes: 0.02 K/uL (ref 0.00–0.07)
Basophils Absolute: 0 K/uL (ref 0.0–0.1)
Basophils Relative: 1 %
Eosinophils Absolute: 0.2 K/uL (ref 0.0–0.5)
Eosinophils Relative: 4 %
HCT: 39.2 % (ref 39.0–52.0)
Hemoglobin: 13.7 g/dL (ref 13.0–17.0)
Immature Granulocytes: 0 %
Lymphocytes Relative: 22 %
Lymphs Abs: 1.4 K/uL (ref 0.7–4.0)
MCH: 31.1 pg (ref 26.0–34.0)
MCHC: 34.9 g/dL (ref 30.0–36.0)
MCV: 89.1 fL (ref 80.0–100.0)
Monocytes Absolute: 0.6 K/uL (ref 0.1–1.0)
Monocytes Relative: 10 %
Neutro Abs: 4.3 K/uL (ref 1.7–7.7)
Neutrophils Relative %: 63 %
Platelets: 286 K/uL (ref 150–400)
RBC: 4.4 MIL/uL (ref 4.22–5.81)
RDW: 12.4 % (ref 11.5–15.5)
WBC: 6.7 K/uL (ref 4.0–10.5)
nRBC: 0 % (ref 0.0–0.2)

## 2024-02-23 LAB — COMPREHENSIVE METABOLIC PANEL WITH GFR
ALT: 22 U/L (ref 0–44)
AST: 26 U/L (ref 15–41)
Albumin: 3.5 g/dL (ref 3.5–5.0)
Alkaline Phosphatase: 55 U/L (ref 38–126)
Anion gap: 12 (ref 5–15)
BUN: 10 mg/dL (ref 8–23)
CO2: 18 mmol/L — ABNORMAL LOW (ref 22–32)
Calcium: 8.8 mg/dL — ABNORMAL LOW (ref 8.9–10.3)
Chloride: 105 mmol/L (ref 98–111)
Creatinine, Ser: 0.89 mg/dL (ref 0.61–1.24)
GFR, Estimated: >60 mL/min (ref >60)
Glucose, Bld: 112 mg/dL — ABNORMAL HIGH (ref 70–99)
Potassium: 3.3 mmol/L — ABNORMAL LOW (ref 3.5–5.1)
Sodium: 135 mmol/L (ref 135–145)
Total Bilirubin: 0.8 mg/dL (ref 0.0–1.2)
Total Protein: 6.8 g/dL (ref 6.5–8.1)

## 2024-02-23 LAB — LIPASE, BLOOD: Lipase: 41 U/L (ref 11–51)

## 2024-02-23 MED ORDER — PANTOPRAZOLE SODIUM 40 MG PO TBEC: 40.0000 mg | DELAYED_RELEASE_TABLET | Freq: Every day | ORAL | 0 refills | Status: DC

## 2024-02-23 MED ORDER — LACTATED RINGERS IV BOLUS
1000.0000 mL | Freq: Once | INTRAVENOUS | Status: AC
  Administered 2024-02-23: 1000 mL via INTRAVENOUS

## 2024-02-23 MED ORDER — PANTOPRAZOLE SODIUM 40 MG PO TBEC
40.0000 mg | DELAYED_RELEASE_TABLET | Freq: Once | ORAL | Status: AC
Start: 2024-02-23 — End: 2024-02-23
  Administered 2024-02-23: 40 mg via ORAL
  Filled 2024-02-23: qty 1

## 2024-02-23 MED ORDER — MORPHINE SULFATE (PF) 4 MG/ML IV SOLN: 4.0000 mg | Freq: Once | INTRAVENOUS | Status: DC

## 2024-02-23 MED ORDER — PANTOPRAZOLE SODIUM 40 MG PO TBEC: 40.0000 mg | DELAYED_RELEASE_TABLET | Freq: Every day | ORAL | 0 refills | Status: AC

## 2024-02-23 MED ORDER — IOHEXOL 300 MG/ML  SOLN
100.0000 mL | Freq: Once | INTRAMUSCULAR | Status: AC | PRN
  Administered 2024-02-23: 100 mL via INTRAVENOUS

## 2024-02-23 MED ORDER — POTASSIUM CHLORIDE CRYS ER 20 MEQ PO TBCR
40.0000 meq | EXTENDED_RELEASE_TABLET | Freq: Once | ORAL | Status: AC
  Administered 2024-02-23: 40 meq via ORAL
  Filled 2024-02-23: qty 2

## 2024-02-23 NOTE — ED Notes (Addendum)
 Fiance reports that patient is significantly more confused than normal. She reports a history a dementia, but that the patient maintains orientation.   Pt had been reporting abdominal pain to fiance for ~1week.

## 2024-02-23 NOTE — ED Notes (Signed)
 Victor Mathis called about pts wallet and phone. She reports she has the phone and pt does not have a wallet.

## 2024-02-23 NOTE — ED Triage Notes (Signed)
 Pt c/o abd pain x 2 hrs ago. Pt c/o n/v. [facility] gave pt 4mg  of zofran . RFA 18 G IV.

## 2024-02-23 NOTE — ED Notes (Signed)
 EDP at bedside during triage

## 2024-02-23 NOTE — ED Notes (Signed)
 Victor Mathis requests notification upon patient dispo and for any significant changes.

## 2024-02-23 NOTE — ED Provider Notes (Signed)
 [facility] EMERGENCY DEPARTMENT AT [facility] Provider Note   CSN: [id number] Arrival date & time: 02/23/24  1700     Patient presents with: Abdominal Pain   Victor Mathis is a 68 y.o. male.   HPI 68 year old male presents with abdominal pain.  He states he has been having some milder pain since waking up this morning in his upper abdomen but then it got worse acutely a little before arrival via EMS.  No chest pain, shortness of breath, radiation of the pain, including no pain in the back.  He has had a couple episodes of nonbloody vomiting and nonbloody diarrhea.  He denies fevers.  No urinary symptoms.  Pain is currently 5 out of 10.  Feels like a throbbing type pain.  Prior to Admission medications   Medication Sig Start Date End Date Taking? Authorizing Provider  benzocaine (ORAJEL) 10 % mucosal gel Use as directed 1 application in the mouth or throat as needed for mouth pain. Patient not taking: Reported on 09/22/2020    [provider]  chlorhexidine  (PERIDEX ) 0.12 % solution Use as directed 15 mLs in the mouth or throat 2 (two) times daily. Patient not taking: Reported on 09/22/2020 08/22/19   Martell [place], PA-C  clotrimazole -betamethasone  (LOTRISONE ) cream Apply to affected area 2 times daily prn 09/22/20   Tobie Schatz, PA-C  ibuprofen  (ADVIL ) 800 MG tablet Take 1 tablet (800 mg total) by mouth 3 (three) times daily. Patient not taking: Reported on 09/22/2020 08/22/19   Martell [place], PA-C  multivitamin-iron-minerals-folic acid (CENTRUM) chewable tablet Chew 0.5 tablets by mouth daily.    [provider]  oxyCODONE -acetaminophen  (PERCOCET/ROXICET) 5-325 MG tablet Take 1-2 tablets by mouth every 6 (six) hours as needed for severe pain. Patient not taking: Reported on 09/22/2020 10/11/19   Petrucelli, Samantha R, PA-C  pantoprazole  (PROTONIX ) 40 MG tablet Take 1 tablet (40 mg total) by mouth daily. 02/23/24   Freddi Hamilton, MD    Allergies: Patient  has no known allergies.    Review of Systems  Constitutional:  Negative for fever.  Respiratory:  Negative for cough and shortness of breath.   Cardiovascular:  Negative for chest pain.  Gastrointestinal:  Positive for abdominal pain, diarrhea and vomiting. Negative for blood in stool.  Genitourinary:  Negative for dysuria.  Musculoskeletal:  Negative for back pain.    Updated Vital Signs BP 139/83   Pulse (!) 58   Temp 97.7 F (36.5 C) (Oral)   Resp 14   Ht 5' 9 (1.753 m)   Wt 68 kg   SpO2 96%   BMI 22.15 kg/m   Physical Exam Vitals and nursing note reviewed.  Constitutional:      General: He is not in acute distress.    Appearance: He is well-developed. He is not ill-appearing or diaphoretic.  HENT:     Head: Normocephalic and atraumatic.  Cardiovascular:     Rate and Rhythm: Normal rate and regular rhythm.     Heart sounds: Normal heart sounds.  Pulmonary:     Effort: Pulmonary effort is normal.     Breath sounds: Normal breath sounds.  Abdominal:     Palpations: Abdomen is soft.     Tenderness: There is abdominal tenderness in the epigastric area and left upper quadrant.  Skin:    General: Skin is warm and dry.  Neurological:     Mental Status: He is alert.     (all labs ordered are listed, but only abnormal results  are displayed) Labs Reviewed  COMPREHENSIVE METABOLIC PANEL WITH GFR - Abnormal; Notable for the following components:      Result Value   Potassium 3.3 (*)    CO2 18 (*)    Glucose, Bld 112 (*)    Calcium 8.8 (*)    All other components within normal limits  LIPASE, BLOOD  CBC WITH DIFFERENTIAL/PLATELET    EKG: EKG Interpretation Date/Time:  Friday February 23 2024 17:17:22 EDT Ventricular Rate:  65 PR Interval:  146 QRS Duration:  102 QT Interval:  399 QTC Calculation: 415 R Axis:   66  Text Interpretation: Sinus rhythm no acute ST/T changes Confirmed by Freddi Hamilton [phone]) on 02/23/2024 5:35:51 PM  Radiology: CT ABDOMEN PELVIS  W CONTRAST Result Date: 02/23/2024 CLINICAL DATA:  Acute onset epigastric abdominal pain associated with nausea and vomiting EXAM: CT ABDOMEN AND PELVIS WITH CONTRAST TECHNIQUE: Multidetector CT imaging of the abdomen and pelvis was performed using the standard protocol following bolus administration of intravenous contrast. RADIATION DOSE REDUCTION: This exam was performed according to the departmental dose-optimization program which includes automated exposure control, adjustment of the mA and/or kV according to patient size and/or use of iterative reconstruction technique. CONTRAST:  OMNIPAQUE  IOHEXOL  300 MG/ML  SOLN COMPARISON:  CT abdomen and pelvis dated 10/11/2019 FINDINGS: Lower chest: No focal consolidation or pulmonary nodule in the lung bases. No pleural effusion or pneumothorax demonstrated. Partially imaged heart size is normal. Hepatobiliary: No focal hepatic lesions. No intra or extrahepatic biliary ductal dilation. Normal gallbladder. Pancreas: No focal lesions or main ductal dilation. Spleen: Normal in size without focal abnormality. Adrenals/Urinary Tract: No adrenal nodules. No suspicious renal mass, calculi or hydronephrosis. No focal bladder wall thickening. Stomach/Bowel: Normal appearance of the stomach. No evidence of bowel wall thickening, distention, or inflammatory changes. Colonic diverticulosis without acute diverticulitis. Normal appendix. Vascular/Lymphatic: No significant vascular findings are present. No enlarged abdominal or pelvic lymph nodes. Reproductive: Enlargement of the prostate with median lobe hypertrophy. Other: No free fluid, fluid collection, or free air. Musculoskeletal: No acute or abnormal lytic or blastic osseous lesions. Multilevel degenerative changes of the partially imaged thoracic and lumbar spine. Small fat-containing bilateral inguinal hernias. IMPRESSION: 1. No acute abdominopelvic findings. 2. Colonic diverticulosis without acute diverticulitis. 3.  Enlargement of the prostate with median lobe hypertrophy. Electronically Signed   By: Limin  Xu M.D.   On: 02/23/2024 19:02     Procedures   Medications Ordered in the ED  morphine  (PF) 4 MG/ML injection 4 mg (0 mg Intravenous Hold 02/23/24 1736)  potassium chloride SA (KLOR-CON M) CR tablet 40 mEq (has no administration in time range)  pantoprazole  (PROTONIX ) EC tablet 40 mg (has no administration in time range)  lactated ringers bolus 1,000 mL (1,000 mLs Intravenous New Bag/Given 02/23/24 1735)  iohexol  (OMNIPAQUE ) 300 MG/ML solution 100 mL (100 mLs Intravenous Contrast Given 02/23/24 1817)                                    Medical Decision Making Amount and/or Complexity of Data Reviewed Labs: ordered.    Details: Mild hypokalemia.  Normal hemoglobin Radiology: ordered and independent interpretation performed.    Details: No bowel obstruction ECG/medicine tests: ordered and independent interpretation performed.    Details: No ischemia  Risk Prescription drug management.   Patient presents with upper abdominal pain.  ECG does not show any obvious ischemia.  Exam shows some  mild to moderate upper abdominal tenderness.  CT is reassuring.  Labs unremarkable besides a mildly low bicarb with normal anion gap.  Probably from the diarrhea/vomiting and he was given IV fluids and oral potassium.  Pain is now completely resolved.  Perhaps this is gastritis, will have him start a PPI.  Otherwise he needs to follow-up with PCP.  Doubt ACS, mesenteric ischemia, etc.  Will discharge home with return precautions.     Final diagnoses:  Acute gastritis without hemorrhage, unspecified gastritis type    ED Discharge Orders          Ordered    pantoprazole  (PROTONIX ) 40 MG tablet  Daily,   Status:  Discontinued        02/23/24 2010    pantoprazole  (PROTONIX ) 40 MG tablet  Daily        02/23/24 2020               Freddi Hamilton, MD 02/23/24 2027

## 2024-02-23 NOTE — ED Notes (Signed)
 [facility] called to transport pt back home.

## 2024-02-23 NOTE — Discharge Instructions (Signed)
 We are treating you for possible stomach inflammation with a proton pump inhibitor called Protonix .  Avoid alcohol, NSAIDs such as ibuprofen , Advil , Aleve, etc. and spicy foods.  Follow-up with your primary care physician for reevaluation.  If you develop new or worsening abdominal pain, vomiting, blood in the vomit or stool, or any other new/concerning symptoms and return to the ER.

## 2024-02-24 ENCOUNTER — Emergency Department (HOSPITAL_COMMUNITY)
Admission: EM | Admit: 2024-02-24 | Discharge: 2024-02-24 | Disposition: A | Discharge status: Home / Self Care | Attending: Emergency Medicine | Admitting: Emergency Medicine

## 2024-02-24 DIAGNOSIS — R109 Unspecified abdominal pain: Secondary | ICD-10-CM | POA: Diagnosis present

## 2024-02-24 DIAGNOSIS — K2920 Alcoholic gastritis without bleeding: Secondary | ICD-10-CM | POA: Diagnosis not present

## 2024-02-24 DIAGNOSIS — R002 Palpitations: Secondary | ICD-10-CM | POA: Insufficient documentation

## 2024-02-24 LAB — BASIC METABOLIC PANEL WITH GFR
Anion gap: 14 (ref 5–15)
BUN: 10 mg/dL (ref 8–23)
CO2: 17 mmol/L — ABNORMAL LOW (ref 22–32)
Calcium: 8.3 mg/dL — ABNORMAL LOW (ref 8.9–10.3)
Chloride: 105 mmol/L (ref 98–111)
Creatinine, Ser: 0.97 mg/dL (ref 0.61–1.24)
GFR, Estimated: >60 mL/min (ref >60)
Glucose, Bld: 104 mg/dL — ABNORMAL HIGH (ref 70–99)
Potassium: 3.3 mmol/L — ABNORMAL LOW (ref 3.5–5.1)
Sodium: 136 mmol/L (ref 135–145)

## 2024-02-24 LAB — CBC
HCT: 39 % (ref 39.0–52.0)
Hemoglobin: 13.7 g/dL (ref 13.0–17.0)
MCH: 31.9 pg (ref 26.0–34.0)
MCHC: 35.1 g/dL (ref 30.0–36.0)
MCV: 90.9 fL (ref 80.0–100.0)
Platelets: 262 K/uL (ref 150–400)
RBC: 4.29 MIL/uL (ref 4.22–5.81)
RDW: 12.5 % (ref 11.5–15.5)
WBC: 11.4 K/uL — ABNORMAL HIGH (ref 4.0–10.5)
nRBC: 0 % (ref 0.0–0.2)

## 2024-02-24 LAB — TROPONIN I (HIGH SENSITIVITY): Troponin I (High Sensitivity): 2 ng/L (ref <18)

## 2024-02-24 MED ORDER — ALUM & MAG HYDROXIDE-SIMETH 200-200-20 MG/5ML PO SUSP
30.0000 mL | Freq: Once | ORAL | Status: AC
  Administered 2024-02-24: 30 mL via ORAL
  Filled 2024-02-24: qty 30

## 2024-02-24 NOTE — Discharge Instructions (Signed)

## 2024-02-24 NOTE — ED Notes (Signed)
 [facility] called to pick patient up states someone will be here with in 30 minutes to pick patient up.

## 2024-02-24 NOTE — ED Triage Notes (Signed)
 [facility] from home. Patient returns with same symptoms of previous visit. Patient was d/c a couple hours ago for n/v abdominal pain. Said he was diagnosed with gastritis.

## 2024-02-24 NOTE — ED Notes (Signed)
 Spoke with SO over the phone about patient's situation. She wants us  to admit him because she can't take care of him at home. She says he has dementia and tonight he got upset and accused her of stealing stuff from him. Advised her that we were waiting for labs to come back before we made any decision about discharging him. She ask that we call her back so she can come up here and talk to someone in person because she doesn't wan to wait in the room with him.

## 2024-02-24 NOTE — ED Provider Notes (Signed)
 [place] EMERGENCY DEPARTMENT AT [facility] Provider Note   CSN: [id number] Arrival date & time: 02/24/24  0146     Patient presents with: Abdominal Pain   Victor Mathis is a 68 y.o. male.   The history is provided by the patient.  Abdominal Pain Associated symptoms: vomiting   Associated symptoms: no fever   Patient with history of hyperlipidemia and alcohol use disorder presents for abdominal pain.  Patient was just seen in the ER yesterday for similar episodes.  At that time he reported vomiting and diarrhea and abdominal pain.  His workup was unremarkable and he was discharged home on a PPI  Since going home, he reports he started having what he felt to be palpitations in his chest and abdominal pain.  He also reports recurrent vomiting.  He also reports he may have had blood in his stool.  He denies any melena  denies any NSAID use Reports he drinks alcohol whenever I go to the store but no alcohol in the past 24 hours  He admits to drinking on a daily basis Past Medical History:  Diagnosis Date   History of cardiac catheterization    No significant CAD 6/13   Hyperlipidemia     Prior to Admission medications   Medication Sig Start Date End Date Taking? Authorizing Provider  multivitamin-iron-minerals-folic acid (CENTRUM) chewable tablet Chew 0.5 tablets by mouth daily.    [provider]  pantoprazole  (PROTONIX ) 40 MG tablet Take 1 tablet (40 mg total) by mouth daily. 02/23/24   Freddi Hamilton, MD    Allergies: Patient has no known allergies.    Review of Systems  Constitutional:  Negative for fever.  Cardiovascular:  Positive for palpitations.  Gastrointestinal:  Positive for abdominal pain and vomiting.    Updated Vital Signs BP 136/88 (BP Location: Left Arm)   Pulse 63   Temp 97.6 F (36.4 C) (Oral)   Resp (!) 21   SpO2 97%   Physical Exam CONSTITUTIONAL: Chronically ill appearing, no acute distress HEAD:  Normocephalic/atraumatic EYES: EOMI/PERRL ENMT: Mucous membranes moist NECK: supple no meningeal signs SPINE/BACK:entire spine nontender CV: S1/S2 noted, no murmurs/rubs/gallops noted LUNGS: Lungs are clear to auscultation bilaterally, no apparent distress ABDOMEN: soft, nontender, no rebound or guarding, bowel sounds noted throughout abdomen Rectal-nurse Jaimie present for exam.  No rectal mass or abscess.  No blood or melena, Hemoccult negative.  Prostate was not palpated NEURO: Pt is awake/alert/appropriate, moves all extremitiesx4.  No facial droop.   EXTREMITIES: pulses normal/equal, full ROM   (all labs ordered are listed, but only abnormal results are displayed) Labs Reviewed  CBC - Abnormal; Notable for the following components:      Result Value   WBC 11.4 (*)    All other components within normal limits  BASIC METABOLIC PANEL WITH GFR - Abnormal; Notable for the following components:   Potassium 3.3 (*)    CO2 17 (*)    Glucose, Bld 104 (*)    Calcium 8.3 (*)    All other components within normal limits  TROPONIN I (HIGH SENSITIVITY)    EKG: EKG Interpretation Date/Time:  Saturday February 24 2024 02:11:25 EDT Ventricular Rate:  58 PR Interval:  153 QRS Duration:  88 QT Interval:  415 QTC Calculation: 408 R Axis:   68  Text Interpretation: Sinus rhythm No significant change since last tracing Confirmed by Midge Golas ([id number]) on 02/24/2024 2:43:42 AM  Radiology: CT ABDOMEN PELVIS W CONTRAST Result Date: 02/23/2024 CLINICAL  DATA:  Acute onset epigastric abdominal pain associated with nausea and vomiting EXAM: CT ABDOMEN AND PELVIS WITH CONTRAST TECHNIQUE: Multidetector CT imaging of the abdomen and pelvis was performed using the standard protocol following bolus administration of intravenous contrast. RADIATION DOSE REDUCTION: This exam was performed according to the departmental dose-optimization program which includes automated exposure control, adjustment of the  mA and/or kV according to patient size and/or use of iterative reconstruction technique. CONTRAST:  OMNIPAQUE  IOHEXOL  300 MG/ML  SOLN COMPARISON:  CT abdomen and pelvis dated 10/11/2019 FINDINGS: Lower chest: No focal consolidation or pulmonary nodule in the lung bases. No pleural effusion or pneumothorax demonstrated. Partially imaged heart size is normal. Hepatobiliary: No focal hepatic lesions. No intra or extrahepatic biliary ductal dilation. Normal gallbladder. Pancreas: No focal lesions or main ductal dilation. Spleen: Normal in size without focal abnormality. Adrenals/Urinary Tract: No adrenal nodules. No suspicious renal mass, calculi or hydronephrosis. No focal bladder wall thickening. Stomach/Bowel: Normal appearance of the stomach. No evidence of bowel wall thickening, distention, or inflammatory changes. Colonic diverticulosis without acute diverticulitis. Normal appendix. Vascular/Lymphatic: No significant vascular findings are present. No enlarged abdominal or pelvic lymph nodes. Reproductive: Enlargement of the prostate with median lobe hypertrophy. Other: No free fluid, fluid collection, or free air. Musculoskeletal: No acute or abnormal lytic or blastic osseous lesions. Multilevel degenerative changes of the partially imaged thoracic and lumbar spine. Small fat-containing bilateral inguinal hernias. IMPRESSION: 1. No acute abdominopelvic findings. 2. Colonic diverticulosis without acute diverticulitis. 3. Enlargement of the prostate with median lobe hypertrophy. Electronically Signed   By: Limin  Xu M.D.   On: 02/23/2024 19:02     Procedures   Medications Ordered in the ED  alum & mag hydroxide-simeth (MAALOX/MYLANTA) 200-200-20 MG/5ML suspension 30 mL (30 mLs Oral Given 02/24/24 0239)    Clinical Course as of 02/24/24 0323  Sat Feb 24, 2024  0250 Patient is a poor historian was just seen in the ER for abdominal pain, vomiting and diarrhea and had negative CT imaging.  He tells me  after going home he vomited had some blood in his stool and had palpitations and some upper abdominal pain.  He admits to daily alcohol use.  Overall his exam is unremarkable.  However since he is a poor historian and reports blood loss and palpitations, will expand his evaluation. [DW]  9681 Workup overall has been unremarkable.  No signs of acute GI bleed.  He has mild dehydration but otherwise unremarkable.  EKG unremarkable, troponin negative.  Low suspicion for ACS at this time.  He just had a CT scan on previous ED visit that was unremarkable.  I suspect this is related to his underlying alcohol use [DW]  0318 I spoke to his fiance Adrien Carder via the phone and confirmed her identity  She had multiple issues to discuss, was wondering if we had checked in for pancreatitis and colon cancer.  She also reports he continues to eat spicy food, drink alcohol and smoke dope During this conversation another person in the background started speaking for Adrien  I advised the patient will be discharged home and needs to follow with his PCP.  Information for PCP has been provided.  Due to his age, I will place a home health consult and case management consult.  [facility] had reported the patient may have dementia but no formal diagnosis has been placed in his records  At this point patient is awake and alert no acute distress and has no new complaints.  His vital signs are overall appropriate and has had a thorough emergency department evaluation [DW]    Clinical Course User Index [DW] Midge Golas, MD                                 Medical Decision Making Amount and/or Complexity of Data Reviewed Labs: ordered. ECG/medicine tests: ordered.  Risk OTC drugs.   This patient presents to the ED for concern of abdominal pain, this involves an extensive number of treatment options, and is a complaint that carries with it a high risk of complications and morbidity.  The differential diagnosis  includes but is not limited to cholecystitis, cholelithiasis, pancreatitis, gastritis, peptic ulcer disease, appendicitis, bowel obstruction, bowel perforation, diverticulitis, AAA, ischemic bowel, Acute coronary syndrome   Comorbidities that complicate the patient evaluation: Patient's presentation is complicated by their history of hyperlipidemia  Social Determinants of Health: Patient's poor health literacy, alcohol use disorder  increases the complexity of managing their presentation  Additional history obtained: Records reviewed previous records reviewed  Lab Tests: I Ordered, and personally interpreted labs.  The pertinent results include:  mild leukocytosis   Cardiac Monitoring: The patient was maintained on a cardiac monitor.  I personally viewed and interpreted the cardiac monitor which showed an underlying rhythm of:  sinus rhythm  Medicines ordered and prescription drug management: I ordered medication including GI cocktail  for abdominal pain  Reevaluation of the patient after these medicines showed that the patient    improved   Reevaluation: After the interventions noted above, I reevaluated the patient and found that they have :improved  Complexity of problems addressed: Patient's presentation is most consistent with  acute presentation with potential threat to life or bodily function  Disposition: After consideration of the diagnostic results and the patient's response to treatment,  I feel that the patent would benefit from discharge  .        Final diagnoses:  Acute alcoholic gastritis without hemorrhage    ED Discharge Orders     None          Midge Golas, MD 02/24/24 [phone]

## 2024-03-24 ENCOUNTER — Encounter (HOSPITAL_COMMUNITY): Payer: Self-pay

## 2024-03-24 ENCOUNTER — Emergency Department (HOSPITAL_COMMUNITY)

## 2024-03-24 ENCOUNTER — Emergency Department (HOSPITAL_COMMUNITY)
Admission: EM | Admit: 2024-03-24 | Discharge: 2024-03-24 | Disposition: A | Discharge status: Home / Self Care | Attending: Emergency Medicine | Admitting: Emergency Medicine

## 2024-03-24 ENCOUNTER — Other Ambulatory Visit: Payer: Self-pay

## 2024-03-24 DIAGNOSIS — Z79899 Other long term (current) drug therapy: Secondary | ICD-10-CM | POA: Diagnosis not present

## 2024-03-24 DIAGNOSIS — B029 Zoster without complications: Secondary | ICD-10-CM | POA: Diagnosis not present

## 2024-03-24 DIAGNOSIS — R21 Rash and other nonspecific skin eruption: Secondary | ICD-10-CM | POA: Diagnosis present

## 2024-03-24 LAB — CBC WITH DIFFERENTIAL/PLATELET
Abs Immature Granulocytes: 0.02 K/uL (ref 0.00–0.07)
Basophils Absolute: 0.1 K/uL (ref 0.0–0.1)
Basophils Relative: 1 %
Eosinophils Absolute: 0.3 K/uL (ref 0.0–0.5)
Eosinophils Relative: 3 %
HCT: 47.8 % (ref 39.0–52.0)
Hemoglobin: 16.7 g/dL (ref 13.0–17.0)
Immature Granulocytes: 0 %
Lymphocytes Relative: 32 %
Lymphs Abs: 2.7 K/uL (ref 0.7–4.0)
MCH: 31.9 pg (ref 26.0–34.0)
MCHC: 34.9 g/dL (ref 30.0–36.0)
MCV: 91.2 fL (ref 80.0–100.0)
Monocytes Absolute: 0.8 K/uL (ref 0.1–1.0)
Monocytes Relative: 9 %
Neutro Abs: 4.5 K/uL (ref 1.7–7.7)
Neutrophils Relative %: 55 %
Platelets: 294 K/uL (ref 150–400)
RBC: 5.24 MIL/uL (ref 4.22–5.81)
RDW: 13.1 % (ref 11.5–15.5)
WBC: 8.4 K/uL (ref 4.0–10.5)
nRBC: 0 % (ref 0.0–0.2)

## 2024-03-24 LAB — COMPREHENSIVE METABOLIC PANEL WITH GFR
ALT: 26 U/L (ref 0–44)
AST: 31 U/L (ref 15–41)
Albumin: 3.8 g/dL (ref 3.5–5.0)
Alkaline Phosphatase: 70 U/L (ref 38–126)
Anion gap: 11 (ref 5–15)
BUN: 6 mg/dL — ABNORMAL LOW (ref 8–23)
CO2: 19 mmol/L — ABNORMAL LOW (ref 22–32)
Calcium: 8.6 mg/dL — ABNORMAL LOW (ref 8.9–10.3)
Chloride: 102 mmol/L (ref 98–111)
Creatinine, Ser: 0.93 mg/dL (ref 0.61–1.24)
GFR, Estimated: >60 mL/min (ref >60)
Glucose, Bld: 97 mg/dL (ref 70–99)
Potassium: 3.3 mmol/L — ABNORMAL LOW (ref 3.5–5.1)
Sodium: 132 mmol/L — ABNORMAL LOW (ref 135–145)
Total Bilirubin: 0.8 mg/dL (ref 0.0–1.2)
Total Protein: 7.4 g/dL (ref 6.5–8.1)

## 2024-03-24 LAB — ETHANOL: Alcohol, Ethyl (B): <15 mg/dL (ref <15)

## 2024-03-24 MED ORDER — VALACYCLOVIR HCL 500 MG PO TABS
1000.0000 mg | ORAL_TABLET | ORAL | Status: AC
  Administered 2024-03-24: 1000 mg via ORAL
  Filled 2024-03-24: qty 2

## 2024-03-24 MED ORDER — KETOROLAC TROMETHAMINE 30 MG/ML IJ SOLN
30.0000 mg | Freq: Once | INTRAMUSCULAR | Status: AC
  Administered 2024-03-24: 30 mg via INTRAVENOUS
  Filled 2024-03-24: qty 1

## 2024-03-24 MED ORDER — IBUPROFEN 600 MG PO TABS: 600.0000 mg | ORAL_TABLET | Freq: Four times a day (QID) | ORAL | 0 refills | Status: AC | PRN

## 2024-03-24 MED ORDER — VALACYCLOVIR HCL 1 G PO TABS: 1000.0000 mg | ORAL_TABLET | Freq: Three times a day (TID) | ORAL | 0 refills | Status: AC

## 2024-03-24 NOTE — ED Triage Notes (Addendum)
 Patient come in [facility] for complaint of a rash on shoulders that started to blister. Also having shortness of breath and arms throbbing and numb.  Stated has have the pain for 4-5 days. Last drink a couple of days ago.

## 2024-03-24 NOTE — ED Provider Notes (Signed)
 [place] EMERGENCY DEPARTMENT AT [facility] Provider Note   CSN: [id number] Arrival date & time: 03/24/24  9248     Patient presents with: Rash, Shortness of Breath, and arm numbness   Victor Mathis is a 68 y.o. male.   HPI   Pt is a 68 y/o male - with hx of heavy alcohol use -the patient has multiple visits to the emergency department over time, his most recent visits came a a month ago when he was seen for alcoholic gastritis.  He lives here locally, drinks alcohol regularly, last drink was several days ago.  The patient's medications are limited to pantoprazole  which she does not take regularly.  He states that approximately 4 to 5 days ago he developed a rash on his upper back on the right side and now has some pain and rash coming down his right arm as well.  The patient also feels short of breath, paramedics reported normal vital signs prehospital.  The patient denies fever, chills, cough, swelling of the legs, abdominal pain, vomiting or diarrhea  Prior to Admission medications   Medication Sig Start Date End Date Taking? Authorizing Provider  ibuprofen  (ADVIL ) 600 MG tablet Take 1 tablet (600 mg total) by mouth every 6 (six) hours as needed. 03/24/24  Yes Cleotilde Rogue, MD  valACYclovir  (VALTREX ) 1000 MG tablet Take 1 tablet (1,000 mg total) by mouth 3 (three) times daily. 03/24/24  Yes Cleotilde Rogue, MD  multivitamin-iron-minerals-folic acid (CENTRUM) chewable tablet Chew 0.5 tablets by mouth daily.    [provider]  pantoprazole  (PROTONIX ) 40 MG tablet Take 1 tablet (40 mg total) by mouth daily. 02/23/24   Freddi Hamilton, MD    Allergies: Patient has no known allergies.    Review of Systems  All other systems reviewed and are negative.   Updated Vital Signs BP 133/83   Pulse 61   Temp 98 F (36.7 C) (Axillary)   Resp 13   Ht 1.753 m (5' 9)   Wt 56.7 kg   SpO2 97%   BMI 18.46 kg/m   Physical Exam Vitals and nursing note reviewed.   Constitutional:      General: He is not in acute distress.    Appearance: He is well-developed.     Comments: Uncomfortable appearing and tachypneic  HENT:     Head: Normocephalic and atraumatic.     Mouth/Throat:     Pharynx: No oropharyngeal exudate.  Eyes:     General: No scleral icterus.       Right eye: No discharge.        Left eye: No discharge.     Conjunctiva/sclera: Conjunctivae normal.     Pupils: Pupils are equal, round, and reactive to light.  Neck:     Thyroid: No thyromegaly.     Vascular: No JVD.  Cardiovascular:     Rate and Rhythm: Normal rate and regular rhythm.     Heart sounds: Normal heart sounds. No murmur heard.    No friction rub. No gallop.  Pulmonary:     Effort: Pulmonary effort is normal. No respiratory distress.     Breath sounds: Normal breath sounds. No wheezing or rales.     Comments: Despite the mild tachypnea the patient's lung sounds are very clear and he speaks in full sentences Abdominal:     General: Bowel sounds are normal. There is no distension.     Palpations: Abdomen is soft. There is no mass.     Tenderness:  There is no abdominal tenderness.  Musculoskeletal:        General: No tenderness. Normal range of motion.     Cervical back: Normal range of motion and neck supple.  Lymphadenopathy:     Cervical: No cervical adenopathy.  Skin:    General: Skin is warm and dry.     Findings: Rash present. No erythema.     Comments: Vesicular rash on the right arm extending from the forearm all the way up towards the posterior right thorax over the scapula and up towards the midline  Neurological:     Mental Status: He is alert.     Coordination: Coordination normal.  Psychiatric:        Behavior: Behavior normal.         (all labs ordered are listed, but only abnormal results are displayed) Labs Reviewed  COMPREHENSIVE METABOLIC PANEL WITH GFR - Abnormal; Notable for the following components:      Result Value   Sodium 132 (*)     Potassium 3.3 (*)    CO2 19 (*)    BUN 6 (*)    Calcium 8.6 (*)    All other components within normal limits  CBC WITH DIFFERENTIAL/PLATELET  ETHANOL    EKG: EKG Interpretation Date/Time:  Sunday March 24 2024 08:04:45 EDT Ventricular Rate:  108 PR Interval:  148 QRS Duration:  86 QT Interval:  411 QTC Calculation: 438 R Axis:   74  Text Interpretation: Sinus tachycardia Multiple premature complexes, vent & supraven unchanged Confirmed by Cleotilde Rogue ([id number]) on 03/24/2024 8:26:42 AM  Radiology: [facility] Chest Port 1 View Result Date: 03/24/2024 EXAM: 1 VIEW XRAY OF THE CHEST 03/24/2024 08:19:45 AM COMPARISON: 09/22/2020 CLINICAL HISTORY: sob, shingles. Per Triage: Patient come in [facility] for complaint of a rash on shoulders that started to blister. Also having shortness of breath and arms throbbing and numb. Stated has have the pain for 4-5 days. Last drink a couple of days ago. MD notified that ; patient has been around a friend who recently traveled to [place]. FINDINGS: LUNGS AND PLEURA: No focal pulmonary opacity. No pulmonary edema. No pleural effusion. No pneumothorax. HEART AND MEDIASTINUM: No acute abnormality of the cardiac and mediastinal silhouettes. BONES AND SOFT TISSUES: Remote left fifth, sixth and seventh rib fractures. No acute osseous abnormality. IMPRESSION: 1. No acute findings. Electronically signed by: Waddell Calk MD 03/24/2024 08:35 AM EDT RP Workstation: [id number]     Procedures   Medications Ordered in the ED  valACYclovir  (VALTREX ) tablet 1,000 mg (1,000 mg Oral Given 03/24/24 0818)  ketorolac  (TORADOL ) 30 MG/ML injection 30 mg (30 mg Intravenous Given 03/24/24 0819)                                    Medical Decision Making Amount and/or Complexity of Data Reviewed Labs: ordered. Radiology: ordered. ECG/medicine tests: ordered.  Risk Prescription drug management.    This patient presents to the ED for concern of rash differential diagnosis  includes shingles it is the most likely answer, it is a dermatomal distribution starting from a cervical radicular pattern.  That being said the patient does appear tachypneic, and although he states that he does drink a lot of alcohol he is not tachycardic or diaphoretic or tremulous on my exam he is just tachypneic.  Will run a few of the test to make sure there is nothing else going on including  x-ray of his chest, EKG and some lab work.  The patient is agreeable to the plan    Additional history obtained   Additional history obtained from Electronic Medical Record External records from outside source obtained and reviewed including prior visits to the emergency department, no recent admissions to the hospital, prior imaging studies reviewed, chest x-ray from several years ago unremarkable   Lab Tests:  I Ordered, and personally interpreted labs.  The pertinent results include: CBC metabolic panel and alcohol levels unremarkable   Imaging Studies ordered:  I ordered imaging studies including chest x-ray I independently visualized and interpreted imaging which showed unremarkable findings including no signs of pneumonia pneumothorax or other abnormalities.  There are remote rib fractures but no other findings I agree with the radiologist interpretation   Medicines ordered and prescription drug management:  I ordered medication including valacyclovir    , Toradol  I have reviewed the patients home medicines and have made adjustments as needed   Problem List / ED Course:  Patient has a clear case of shingles, no complicating factors, the patient was mildly tachypneic but this has improved significantly since arrival.  Last respiratory rate was 13 with an oxygen of 97% and normal pulse of 61 afebrile with a normal blood pressure normal x-ray normal EKG.   Social Determinants of Health:  Alcohol use       Final diagnoses:  Herpes zoster without complication    ED Discharge  Orders          Ordered    valACYclovir  (VALTREX ) 1000 MG tablet  3 times daily        03/24/24 0904    ibuprofen  (ADVIL ) 600 MG tablet  Every 6 hours PRN        03/24/24 0904               Cleotilde Rogue, MD 03/24/24 [phone]

## 2024-03-24 NOTE — ED Notes (Signed)
 MD notified that patient has been around a friend who recently traveled to [place].

## 2024-03-24 NOTE — ED Notes (Signed)
Pt ambulated with standby assistance to the bathroom.

## 2024-03-24 NOTE — ED Notes (Signed)
 ED Provider at bedside.

## 2024-03-24 NOTE — Discharge Instructions (Signed)
 If you do not have a family doctor please see the list below.  You will need to follow-up within 1 week but be aware that the rash can last for weeks it is often painful to the touch and it is highly contagious to others until it has crusted over.  Please take Valtrex  3 times a day for the next 7 days and I have prescribed a prescription strength ibuprofen  which may help with some of the discomfort.  [facility] Primary Care Doctor List    Rollene Pesa, MD. Specialty: [facility] Medicine Contact information: [address]  [place] [address]  [phone]   Glendia Fielding, MD. Specialty: [facility] Medicine Contact information: [address]  [place] [address]  [phone]   Benita Outhouse, MD Specialty: Internal Medicine Contact information: [address] [place] [address]  [phone]   Darlyn Hurst, MD. Specialty: Internal Medicine Contact information: [address]  [place] [address]  [phone]    [facility] (Dr. Luke) Specialty: Family Medicine Contact information: [address]  [place] [address]  [phone]   Gaither Langton, MD. Specialty: Internal Medicine Contact information: [address]  [address]  [place] [address]  [phone]   [facility]  [address] [place], [place] [address] [phone]  Services [facility] offers a variety of basic health services.  People needing more complex services will be directed to a physician online. Using these virtual visits, doctors can evaluate and prescribe medicine and treatments. There will be no medication on-site, though [facility] will help patients fill their prescriptions at little to no cost.   [facility] PRIMARY CARE [address] [place]  [address]  [phone]     [facility] at [facility] [address] [place]  [address]  [phone]   For More information please go to: [email or web address]
